# Patient Record
Sex: Male | Born: 1965
Health system: Southern US, Community
[De-identification: ages and names within clinical notes are randomized; demographics above are authoritative.]

## PROBLEM LIST (undated history)

## (undated) DIAGNOSIS — I2699 Other pulmonary embolism without acute cor pulmonale: Secondary | ICD-10-CM

## (undated) DIAGNOSIS — C801 Malignant (primary) neoplasm, unspecified: Secondary | ICD-10-CM

## (undated) DIAGNOSIS — M5430 Sciatica, unspecified side: Secondary | ICD-10-CM

## (undated) HISTORY — PX: ANKLE SURGERY: SHX546

## (undated) HISTORY — PX: OTHER SURGICAL HISTORY: SHX169

---

## 2001-03-01 HISTORY — PX: OTHER SURGICAL HISTORY: SHX169

## 2004-02-06 ENCOUNTER — Emergency Department (HOSPITAL_COMMUNITY): Admission: EM | Admit: 2004-02-06 | Discharge: 2004-02-06 | Payer: Self-pay | Admitting: Emergency Medicine

## 2004-09-01 ENCOUNTER — Emergency Department (HOSPITAL_COMMUNITY): Admission: EM | Admit: 2004-09-01 | Discharge: 2004-09-01 | Payer: Self-pay | Admitting: Emergency Medicine

## 2004-12-30 ENCOUNTER — Emergency Department (HOSPITAL_COMMUNITY): Admission: EM | Admit: 2004-12-30 | Discharge: 2004-12-30 | Payer: Self-pay | Admitting: Emergency Medicine

## 2005-01-04 ENCOUNTER — Inpatient Hospital Stay (HOSPITAL_COMMUNITY): Admission: EM | Admit: 2005-01-04 | Discharge: 2005-01-19 | Payer: Self-pay | Admitting: Emergency Medicine

## 2005-01-05 ENCOUNTER — Encounter (INDEPENDENT_AMBULATORY_CARE_PROVIDER_SITE_OTHER): Payer: Self-pay | Admitting: Cardiology

## 2005-01-09 ENCOUNTER — Ambulatory Visit: Payer: Self-pay | Admitting: Emergency Medicine

## 2005-04-20 ENCOUNTER — Inpatient Hospital Stay (HOSPITAL_COMMUNITY): Admission: EM | Admit: 2005-04-20 | Discharge: 2005-04-28 | Payer: Self-pay | Admitting: Emergency Medicine

## 2005-04-20 ENCOUNTER — Ambulatory Visit: Payer: Self-pay | Admitting: Family Medicine

## 2010-06-06 ENCOUNTER — Emergency Department (HOSPITAL_COMMUNITY)
Admission: EM | Admit: 2010-06-06 | Discharge: 2010-06-06 | Payer: Self-pay | Source: Home / Self Care | Admitting: Emergency Medicine

## 2010-06-22 ENCOUNTER — Encounter: Payer: Self-pay | Admitting: Orthopedic Surgery

## 2010-10-17 NOTE — Discharge Summary (Signed)
NAME:  Omar Cross, VITAL NO.:  [id number]   MEDICAL RECORD NO.:  [id number]          PATIENT TYPE:  INP   LOCATION:  3703                         FACILITY:  [facility]   PHYSICIAN:  Margaretmary Bayley, M.D.    DATE OF BIRTH:  1965/06/29   DATE OF ADMISSION:  01/04/2005  DATE OF DISCHARGE:  01/19/2005                                 DISCHARGE SUMMARY   DISCHARGE DIAGNOSES:  Acute pulmonary embolization with infarction and  pruritus status post and recent history of a fracture of the lateral  malleolus and status post operative reduction and internal fixation of the  tibia and fibula, chronic cigarette abuse, multiple drug abuse including  cocaine and alcohol.   REASON FOR ADMISSION:  Omar Cross is a 45 year old African American  gentleman and father of five who is seen in the emergency room on the day of  admission with a chief complaint of significant lateral and anterior chest  pain and shortness of breath.  The patient states that he had had his first  episode of chest pain about two days prior to admission and about two to  three days subsequent to sustaining a fracture of his right ankle.  The  patient states that an initial episode of chest pain was fairly significant  and predominantly involved the right greater than left chest area.  He  stated that he had no significant shortness of breath, no diaphoresis,  nausea and vomiting and no syncope or presyncope.  He had a second bout of  chest pain on the day of admission which was much more intense to the point  where he found it very difficult to breathe and each time he tried to take  an inhalation, he developed significant pain in the right greater than left  lateral chest area.  He stated that he felt like he was going to faint and  he felt quite drained and was subsequently brought into the emergency room  for evaluation.  On his evaluation in the emergency room, it was noted that  the patient did have some  tachypnea and was noted to be very uncomfortable.  A subsequent work up was remarkable for an elevation of his D-dimer levels  as well as a spiral CT scan of the chest remarkable for changes consistent  with an acute pulmonary embolization and the patient is admitted.   PERTINENT PHYSICAL FINDINGS:  GENERAL:  He is a well-developed, well-  nourished gentleman appearing quite anxious.  VITAL SIGNS:  His blood pressure is 110/78 with a pulse rate of 84.  Temperature is 98.2 and respiratory rate is 24-28 per minute.  SKIN:  His skin is cool and moist.  NECK:  His neck is without any jugulovenous distention.  CHEST:  There was a decrease in inspiration with some splinting of the right  lateral chest area.  He had diminished breath sounds at the bases and  particularly on the right in the posterolateral region.  No wheezes or  consolidation was noted.  CARDIAC:  His precordium was hyperdynamic at 3+/4.  He had a regular rhythm  and no gallops or rubs were  noted.  ABDOMEN:  Soft, nontender.  No distention.  There was no organomegaly.  No  masses.  EXTREMITIES:  He had no cyanosis, clubbing or edema on the left.  His right  distal extremity was remarkable for a soft cast which had been evaluated by  orthopedics several days prior to admission.  He had no evidence of any  perfusion deficits on the toes.   PERTINENT LABORATORY DATA:  A spiral CT scan did reveal changes consistent  with bilateral pulmonary emboli with effusions, right greater than left.  There was a fairly large embolus seen within the posterior basilar right  lower lobe pulmonary artery branch.  There was air space disease distal to  this area and felt to be consistent with a pulmonary infarction.  There was  no mediastinal lymphadenopathy and his central airways were all patent.  These changes were felt to be consistent with an acute subsegmental  pulmonary embolus involving the posterior basilar segment of the right lower   lobe with infarction.  A subsequent venous Doppler revealed on evidence of  deep venous thrombosis, no supraventricular tachycardia or Baker's cyst.   HOSPITAL COURSE:  Omar Cross was admitted and initially was started on a  continuous infusion of heparin before having this regimen changed to Lovenox  given by injections b.i.d.  He continued to have fairly significant  pleuritic chest pain for the initial two to three days of his  hospitalization and initially did have some hypoxemia which was treated with  nasal tube.  A 2D echocardiogram revealed some mild mitral regurgitation but  there was no evidence of significant pulmonary artery hypertension and there  was no evidence of source of emboli found in the heart.  As noted above,  there was likewise no definitive deep venous thrombosis in the lower  extremities however, it was felt that his pulmonary embolus was probably  related to the fracture in his leg sustained a little less than one week  prior to this episode.  The patient was seen in consultation by orthopedic  surgery who felt that the patient would have to have open reduction and  internal fixation of the fracture site.  Because of  his impending surgery,  the patient's coumadinization was delayed until his postoperative period  occurred.  The patient was a fairly challenging individual as far as pain  management.  This was felt to be related to the fact that he was a cocaine  abuser as well as alcohol and other drugs, making the dose of his analgesia  much more significant than the average patient.  He was subsequently taken  to the OR after having his Lovenox discontinued and normalization of his  PTT.  The patient underwent the operative procedure without any  difficulties.  There was no significant intra or postoperative bleeding.  Postoperatively, the patient was again heparinized and started on Lovenox and after a three day period of therapeutic Lovenox,  coumadinization was  begun.  He had no significant episodes of rectal or other bleeding during  his period of anticoagulation.  Healing was fairly prompt and the patient  was mobilized fairly promptly by orthopedic surgery.  His condition at the  time of discharge was significantly improved.  He continued to require  fairly significant oral analgesics in the form of Dilaudid as well as the  addition of Lexapro 10 mg per day for some depression which was or has been  a problem in the past.  His discharge Coumadin dose  is 10 mg every other day  alternating with 12.5.  His Coumadin dosing will be managed through my  office as an outpatient and the patient is instructed on the interactions of  his medications as well as diet regarding the effects on his Coumadin  requirement.  He is scheduled to be seen back in the office in two weeks.  He will be seen by Dr. Ranell Patrick, the orthopedic surgeon in one week to  evaluate his postoperative care and the incision site.           ______________________________  Margaretmary Bayley, M.D.     PC/MEDQ  D:  04/08/2005  T:  04/09/2005  Job:  [id number]

## 2010-10-17 NOTE — H&P (Signed)
NAME:  Omar Cross, Omar Cross          ACCOUNT NO.:  [id number]   MEDICAL RECORD NO.:  [id number]          PATIENT TYPE:  INP   LOCATION:  1825                         FACILITY:  [facility]   PHYSICIAN:  Wayne A. Sheffield Slider, M.D.    DATE OF BIRTH:  Jul 29, 1965   DATE OF ADMISSION:  04/20/2005  DATE OF DISCHARGE:                                HISTORY & PHYSICAL   PRIMARY CARE PHYSICIAN:  Unassigned.   REFERRING PHYSICIAN:  None.   CHIEF COMPLAINT:  Shortness of breath and chest pain.   HISTORY OF PRESENT ILLNESS:  Add is a 45 year old African-American male  with history of ORIF right ankle in August as well as PE in August that  occurred after the ankle fracture, UTI with shortness of breath and chest  pain x1 week which began shortly after stopped taking Coumadin as  prescribed.  He began having intense lateral chest pain one week ago with  dyspnea on exertion.  Due to his prior experience in August with a pulmonary  embolus with similar symptoms, he began taking 5 mg Coumadin daily instead  of his normal dose of 2.5 mg on odd days and 2 mg on even days.  This seemed  to cause the pain to lessen.  However, the pain never completely  disappeared, so Omar Cross came to the ED today.  For the past two weeks, he  has been taking Coumadin off and on, says he has been running out of pills  and his child support has been sent to him from the mother who has 5  children who stay with him.  Today he rates the pain as 9/10.  There is no  pain when he is not breathing.  If he takes a breath, he has a sharp pain in  his right side.  Deep breathing causes coughing.  He denies fever, lower  extremity swelling, or erythema.  The patient was supposed to see his  orthopedist two months after his surgery for the ankle fracture; however, he  did not return, and thus his cast was still there today.  Cast was removed  in the ED.  The patient has not seen any physician since his hospitalization  in August.   In the  emergency department, he was given 2 Percocet.  Also, a CAM boot was  placed on his right foot, and the cast was removed.   PAST MEDICAL HISTORY:  1.  ORIF of right ankle by Dr. Ranell Patrick on January 12, 2005.  2.  January 06, 2005, PE in the right lower lobe which occurred right after he      had fractured his foot but prior to the surgery.  He was placed on      Coumadin and continued on this until recently.  3.  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  He follows up at [facility] every year for this      and has had no recurrence.  4.  Hernia repair at age 56 or 30.  5.  His left index finger  was crushed a long time ago, and he has had      surgery for this.  6.  History of broken left leg.   ALLERGIES:  No known drug allergies.   MEDICATIONS:  The patient is only taking Coumadin 2.5 mg p.o. on odd days  and 2 mg on even days.   FAMILY HISTORY:  Significant for uncle and aunt both on Coumadin.  Aunt had  DVT, uncle had PE.  Also mother has thyroid problems as well as sickle cell  trait.  Father is alive and well.  Sister is on dialysis for lupus.  Two  grandmothers, one on mom's side, one on dad's side, CVAs, one in her 51s,  the other in 77s.   SOCIAL HISTORY:  [place] with his five children ages 74, 69, 76, 61, and 2,  here in [place].  Works as a Surveyor, minerals on homes.  He has a history of  substance abuse.  He drinks 12 ounces of alcohol 3 to 4 times per weeks.  He  smokes 2 packs of cigarettes per week.  He used significantly more alcohol  and smoked more prior to surgery in August.  He also has a history of using  multiple illegal drugs; however, he denies having used these for many years.  He uses a cane and wheelchair to get around.  He has difficulty affording  medication.  His mother and children pay him child support.   REVIEW OF SYSTEMS:  The patient has experienced no weight changes or fevers,  no chills.   HEENT:  No rhinorrhea, no sore throat.  Coughing with deep  breathing.  Per HPI.  Chest pain is mainly on his right side, although on  his left that is sharp and occurs mainly when he breathes in deeply.  No  nausea, vomiting, diarrhea.  No constipation.  No hematemesis.  No  hematuria.  The patient denies weakness.  He has had some lightheadedness  when breathing in deeply; however, no syncope, no numbness or tingling.  Also denies any problems urinating.  No weakness in one side versus the  other.   ADVANCED DIRECTIVES:  The patient is a full code.   PHYSICAL EXAMINATION:  GENERAL:  The patient is a pleasant African-American  male in no acute distress.  VITAL SIGNS:  Temperature 96.7, heart rate 64, blood pressure 172/82,  respiratory rate 20, O2 saturation 99% on room air.  HEENT:  Extraocular muscles intact.  Pupils equal, round, and reactive to  light and accommodation.  Sclerae and conjunctivae normal.  Oropharynx  without exudate or erythema.  Mucous membranes moist.  NECK:  Shotty lymphadenopathy.  Normal thyroid.  CARDIOVASCULAR:  Regular rate and rhythm without murmurs, rubs, or gallops.  LUNGS:  Clear to auscultation bilaterally but decreased breath sounds in the  right lower lobe.  ABDOMEN:  Normoactive bowel sounds.  Soft, nondistended, nontender.  No  hepatosplenomegaly.  EXTREMITIES:  Right leg is significantly smaller than left.  Cast on right  foot with considerable drawing and thick peeling of skin.  No erythema or  edema in either extremity.  MUSCULOSKELETAL/NEUROLOGIC: The patient has normal muscle strength except  for right ankle.  Range of motion in his right ankle is constricted due to  pain.  Cranial nerves II-XII grossly intact.  The patient was alert and  oriented x3 with 2+ reflexes on the right, no reflex able to be elicited on  the left.   LABORATORY DATA:  Test results on labs:  PT 12.5, PTT 29, INR 0.9. Hematocrit 47, hemoglobin 16.  Sodium 138,  potassium 3.8, chloride 106, BUN  15, creatinine 1.1, glucose 8.9.  on latest blood gas, pH 7.37, PCO2 47,  bicarb 27.4, excess 1.   Chest x-ray showed air space opacities posteriorly in the right lower lobe.   Chest CT, PE protocol showed possible PE in right lower lobe subsegmental  right lower lobe PA branch, same artery as in August.   Plain films of the ankle showed osteolysis of the right ankle due to disuse.   ASSESSMENT AND PLAN:  The patient is a 45 year old African-American male  with history of open reduction and internal fixation of his right ankle,  pulmonary embolus, and substance abuse, now here with shortness of breath  and pulmonary embolus likely secondary to pulmonary embolus seen on spiral  CT.  1.  Pulmonary embolus.  The patient is subtherapeutic on Coumadin.  Will      give 1 dose Coumadin 5 mg and start IV heparin per pharmacy and      transition to Coumadin. Will get hemoccult prior to this to assure no      occult bleeding that would be a contraindication to heparin.  EKG as      this has not been done yet.  Will also get analyzed PT, INR, and PTT      ever day.  2.  Open reduction and internal fixation right ankle.  The patient just      recently had cast removed.  Ankle films show osteolysis most likely due      to disuse.  Will place in CAM boot.  PT will be consulted.  We will      ensure patient follows up with orthopedics.  3.  History of alcohol and substance abuse.  We will continue to monitor for      signs of withdrawal while in house.  Urine drug screen was ordered.      Will give multivitamins,  thiamine, folate, normal saline 125 mL per      hour.  4.  Pain control.  Will given ibuprofen 63 mg p.o. q.6 h. p.r.n. as well as      Percocet 5/325 q.4 h. p.r.n.  Has history of possibility of abusing pain      medicines, will try to avoid Dilaudid.  5.  Fluid, electrolytes, and nutrition and gastrointestinal.  Electrolytes      were normal.  Will  start D5 half normal saline KVO since need IV for      heparin.  Regular diet.  Mild of magnesia p.r.n. constipation since on      Percocet.  6.  Disposition.  Will consider social work consult since unable to afford      medications.  Will discharge pending patient has therapeutic INR and is      on Coumadin.      Dwana Curd Para March, M.D.    ______________________________  Arnette Norris. Sheffield Slider, M.D.    GSD/MEDQ  D:  04/20/2005  T:  04/20/2005  Job:  [id number]

## 2010-10-17 NOTE — Op Note (Signed)
NAME:  Omar Cross, Omar Cross NO.:  [id number]   MEDICAL RECORD NO.:  [id number]          PATIENT TYPE:  INP   LOCATION:  3703                         FACILITY:  [facility]   PHYSICIAN:  Almedia Balls. Ranell Patrick, M.D. DATE OF BIRTH:  02-Aug-1965   DATE OF PROCEDURE:  01/12/2005  DATE OF DISCHARGE:                                 OPERATIVE REPORT   PREOPERATIVE DIAGNOSIS:  Right ankle fracture.   POSTOPERATIVE DIAGNOSIS:  Right ankle fracture.   PROCEDURE PERFORMED:  Open reduction and internal fixation of right  pronation and external rotation for ankle fracture-dislocation with  placement of syndesmosis screw.   ATTENDING SURGEON:  Almedia Balls. Ranell Patrick, MD.   ASSISTANT:  ___________ PA-C.   ANESTHESIA:  General anesthesia.   ESTIMATED BLOOD LOSS:  None.   TOURNIQUET TIME:  One hour.   INSTRUMENT COUNT:  Correct.   COMPLICATIONS:  None.   MEDICATIONS GIVEN:  Preoperative antibiotics were given.   INDICATIONS:  The patient is a 45 year old male, who suffered a fracture-  dislocation of the right ankle.  The patient is currently admitted for a  pulmonary embolism and had to delay the surgery of about 2 weeks.  The  patient presents now for operative reduction and stabilization of his  unstable ankle fracture.  Informed consent was obtained.   DESCRIPTION OF PROCEDURE:  After an adequate level of anesthesia was  achieved, the patient was positioned on the radiolucent operating room  table.  The C-arm was utilized throughout the case in multiple planes.  A  lateral approach to the distal tibia was performed using a 10-blade scalpel  dissection and was carried sharply down through the subcutaneous tissues.  The periosteum was identified and incised sharply, large full thickness  flaps were raised, the fracture site was identified, it was curetted free of  soft tissue and early fracture callus.  We immobilized the fracture,  irrigated and debrided the joint, which we could see  through the lateral  fracture line, and then we anatomically reduced the fracture using a crab-  claw clamp, axial traction, and internal rotation.  We then placed a single  anterior to posterior lag screw, which gained fairly good fixation and  anatomic reduction.  We then next placed an anti-glide plate to prevent  proximal migration given that this was 2 weeks out and fairly shortened, and  this was a way to close the door on any proximal migration of the fracture.  We then placed a neutralization plate on laterally.  This was an 8-hole one-  third tubular plate with 3 bicortical screws proximally and 2 fully threaded  cancellous screws distally, and then placed a single 3.5 mm syndesmosis  screw through the neutralization plate laterally to medially into the tibia.  We had a placed a king-kong clamp to apply a nice medial and lateral  compression, made sure the joint mortise was anatomic, and then fixed the  syndesmosis with a single 3.5 4-cortex screw.  We fully irrigated the wound  and closed it in a layered closure using Vicryl and staples.  A sterile  dressing was applied followed by a short-leg  splint.  The patient tolerated  the surgery well.  Again, a multiplanar C-arm was utilized for fracture  reduction, for hardware placement, and for final pictures.  The patient was  taken to the recovery room in stable condition.           ______________________________  Almedia Balls Ranell Patrick, M.D.     SRN/MEDQ  D:  01/12/2005  T:  01/12/2005  Job:  [id number]

## 2010-10-17 NOTE — Discharge Summary (Signed)
NAME:  RANIER, COACH          ACCOUNT NO.:  [id number]   MEDICAL RECORD NO.:  [id number]          PATIENT TYPE:  INP   LOCATION:  5511                         FACILITY:  [facility]   PHYSICIAN:  Santiago Bumpers. Hensel, M.D.DATE OF BIRTH:  1966-05-20   DATE OF ADMISSION:  04/20/2005  DATE OF DISCHARGE:  04/28/2005                                 DISCHARGE SUMMARY   PRIMARY CARE PHYSICIAN:  Margaretmary Bayley, M.D.   FINAL DIAGNOSES:  1.  Pulmonary embolism.  2.  Mood disorder.  3.  Substance abuse.  4.  Right foot fracture.   PRINCIPAL PROCEDURES:  1.  CT angiogram of the chest on April 20, 2005, showed a thrombus in the      right lower lobe pulmonary artery branch.  The same artery was involved      on the prior study in August 2006, but there is now more involvement of      this vessel, likely due to a new embolus as opposed to a residual      thrombus.  Also some right lower lobe air space disease distal to this      filling defect but difficult to say if this is new disease or residual      infarct from the prior insult in August 2006.  2.  Post-ORIF ankle views for fibular fracture after cast removal on      April 20, 2005, showed good position and alignment following ORIF for      distal fibular fracture but significant osteolysis had developed, which      was presumed to be due to osteoporosis.   LABORATORY DATA:  On discharge on April 28, 2005, CBC:  White blood cells  8.8, hemoglobin 14.7, hematocrit 42.6, platelets 293.  PT was 24.5, INR 2.2.  The patient had a urine drug screen close to beginning of hospitalization on  April 21, 2005, which was positive for cocaine.  Cardiolipin antibody  April 22, 2005, was within normal limits.  Fecal occult blood on April 21, 2005, was negative.  On admission, April 20, 2005, the patient's PT  was 12.5 and INR was 0.9.  On April 20, 2005, venous blood gas showed pH  of 7.374, PCO2 of 47, bicarb of 27.4, total CO2 of  29, acid-base excess of  1.0.  Sodium 138, potassium 3.8, chloride 106, glucose 89, BUN 15.   HISTORY OF PRESENT ILLNESS:  Lajuane is a 45 year old African-American male  with history of open reduction and internal fixation of right fibular  fracture in August as well as a PE in August that occurred after the ankle  fracture.  He presented to [place] with shortness of breath and chest pain x1  week that began shortly after he stopped taking his Coumadin as prescribed  due to being unable to afford it.  The patient also came in with his cast  still on his right foot, which should have been removed earlier by the  orthopedist; however, the patient did not go to his appointment.  He also  has a history of substance abuse and depression.   HOSPITAL COURSE:  The patient is a 45 year old African-American male with a  right ankle ORIF, resolving PE, urine drug screen positive for cocaine, and  mood disorder.   Problem 1.  PULMONARY EMBOLISM:  The patient was admitted and started on  heparin and Coumadin per pharmacy protocol with goal INR of 2-3.  INR  initially was 0.9 on admission and subsequently rose to 2.2 on discharge.  The patient's symptoms resolved during hospitalization.  At discharge he was  free of chest pain, shortness of breath and coughing.  The patient never had  any symptoms of DVT.  We spoke with the patient about the importance of  follow-up appointments to check his INR level as well as the importance of  taking his daily Coumadin.  Due to his aunt having elevated antithrombin-3,  may consider doing a coagulation workup at a later date as an outpatient  once he is off heparin.  His cardiolipin antibody was negative here in the  hospital.  We do feel that we have a reason for the PE due to patient's  immobilization from ORIF in August.  This is probably more likely than a  hereditary coagulation problem.   Problem 2.  MOOD DISORDER:  The patient was previously on Lexapro after  last  hospitalization in August.  Due to his extremely happy mood and history of  difficulty sleeping and often spending large amounts of money, we consulted  psychiatry to work up possible bipolar disorder.  Psychiatry placed the  patient on Wellbutrin, Lexapro and Seroquel.  We will send the patient home  on these as well and have him follow up with psychiatry as an outpatient.   Problem 3.  RIGHT ANKLE FRACTURE AND OPEN REDUCTION AND INTERNAL FIXATION:  The patient was followed by PT in the hospital.  A CAM boot was placed in  the emergency department.  He has been able to walk with crutches since put  on weightbearing status.  He will follow up with orthopedics as an  outpatient.  We will emphasize the importance of this.   Problem 4.  SUBSTANCE ABUSE:  The patient denied any substance; however, his  UDS was positive for cocaine.  There was some worry regarding him taking  care of his five children at home and using drugs.  Social worker saw the  patient and could find no evidence the patient was using cocaine around the  children and no evidence of neglect, so did not call DSS.  The patient was  initially placed on Percocet and Tylenol for pain management; however, due  to seeking pain medications, Percocet was discontinued as soon as possible.   CONDITION ON DISCHARGE:  Much improved.   INSTRUCTIONS TO PATIENT AND FAMILY:  The patient is to ambulate with  crutches.  He is to follow up with PT, orthopedics, psychiatry and his  primary care physician, Dr. Chestine Spore.  The patient will arrange appointment  with Dr. Chestine Spore himself and Dr. Chestine Spore is to arrange referrals and follow-ups  for orthopedics, PT and psychiatry.   DIET:  Regular.   DISCHARGE MEDICATIONS:  1.  Wellbutrin 150 mg XL p.o. daily.  2.  Lexapro 10 mg p.o. daily.  3.  Seroquel 50 mg p.o. q.h.s.  4.  Coumadin 10 mg p.o. every Saturday, Sunday, Tuesday and Thursday,     alternating with Coumadin 12.5 mg p.o. every  Monday, Wednesday and      Friday.  5.  Ambien 10 mg p.o. p.r.n. sleep.  Hansel Feinstein, M.D.    ______________________________  Santiago Bumpers. Leveda Anna, M.D.    PM/MEDQ  D:  04/28/2005  T:  04/29/2005  Job:  [id number]

## 2012-01-18 ENCOUNTER — Encounter (HOSPITAL_COMMUNITY): Payer: Self-pay | Admitting: *Deleted

## 2012-01-18 ENCOUNTER — Emergency Department (HOSPITAL_COMMUNITY)
Admission: EM | Admit: 2012-01-18 | Discharge: 2012-01-18 | Disposition: A | Discharge status: Home / Self Care | Payer: Self-pay | Attending: Emergency Medicine | Admitting: Emergency Medicine

## 2012-01-18 DIAGNOSIS — M5430 Sciatica, unspecified side: Secondary | ICD-10-CM | POA: Insufficient documentation

## 2012-01-18 DIAGNOSIS — Z8589 Personal history of malignant neoplasm of other organs and systems: Secondary | ICD-10-CM | POA: Insufficient documentation

## 2012-01-18 DIAGNOSIS — M549 Dorsalgia, unspecified: Secondary | ICD-10-CM

## 2012-01-18 DIAGNOSIS — F17200 Nicotine dependence, unspecified, uncomplicated: Secondary | ICD-10-CM | POA: Insufficient documentation

## 2012-01-18 HISTORY — DX: Sciatica, unspecified side: M54.30

## 2012-01-18 HISTORY — DX: Malignant (primary) neoplasm, unspecified: C80.1

## 2012-01-18 MED ORDER — KETOROLAC TROMETHAMINE 60 MG/2ML IM SOLN
60.0000 mg | Freq: Once | INTRAMUSCULAR | Status: AC
  Administered 2012-01-18: 60 mg via INTRAMUSCULAR
  Filled 2012-01-18: qty 2

## 2012-01-18 MED ORDER — OXYCODONE-ACETAMINOPHEN 5-325 MG PO TABS
1.0000 | ORAL_TABLET | Freq: Once | ORAL | Status: AC
  Administered 2012-01-18: 1 via ORAL
  Filled 2012-01-18: qty 1

## 2012-01-18 MED ORDER — IBUPROFEN 800 MG PO TABS: 800.0000 mg | ORAL_TABLET | Freq: Three times a day (TID) | ORAL | Status: AC | PRN

## 2012-01-18 MED ORDER — PERCOCET 5-325 MG PO TABS: 1.0000 | ORAL_TABLET | Freq: Four times a day (QID) | ORAL | Status: AC | PRN

## 2012-01-18 MED ORDER — CYCLOBENZAPRINE HCL 10 MG PO TABS: 10.0000 mg | ORAL_TABLET | Freq: Three times a day (TID) | ORAL | Status: AC | PRN

## 2012-01-18 MED ORDER — HYDROMORPHONE HCL PF 1 MG/ML IJ SOLN
1.0000 mg | Freq: Once | INTRAMUSCULAR | Status: AC
  Administered 2012-01-18: 1 mg via INTRAMUSCULAR
  Filled 2012-01-18: qty 1

## 2012-01-18 NOTE — ED Provider Notes (Signed)
History     CSN: [id number]  Arrival date & time 01/18/12  4098   First MD Initiated Contact with Patient 01/18/12 [phone]      Chief Complaint  Patient presents with  . Hip Pain    right    (Consider location/radiation/quality/duration/timing/severity/associated sxs/prior treatment) HPI Comments: Pt is a current every day smoker with a hx of fibrosarcoma and chronic back pain that presents to the ER for his sciatica. Pt states that he has been followed by his PCP over the last 2 years being diagnosed w sciatica. He was treated previously with muscle relaxers, gabapentin and percocet, but hasn't needed medication for months. The recent exacerbation started about 3 days ago and has been gradually worsening until this morning it became unbearable. Pain is 10/10 in severity with radiation down right leg to about the level of the knee. Pain described as a shooting/stabbing sensation that is worsened by any movement especially ambulation. Pt denies any numbness, tingling or weakness of extremity, bowel or bladder dysfunction, saddle paresthesias, fever, night sweats or chills.   Patient is a 46 y.o. male presenting with hip pain. The history is provided by the patient and the spouse.  Hip Pain This is a chronic problem. The current episode started yesterday. The problem occurs constantly. The problem has been gradually worsening. Pertinent negatives include no abdominal pain, anorexia, arthralgias, change in bowel habit, chest pain, chills, congestion, coughing, diaphoresis, fatigue, fever, headaches, joint swelling, myalgias, nausea, neck pain, numbness, rash, sore throat, swollen glands, urinary symptoms, vertigo, visual change, vomiting or weakness. The symptoms are aggravated by twisting, walking, standing, bending and intercourse. Treatments tried: tramadol. The treatment provided no relief.    Past Medical History  Diagnosis Date  . Sciatic pain   . Cancer     Fibrosarcoma    History  reviewed. No pertinent past surgical history.  No family history on file.  History  Substance Use Topics  . Smoking status: Current Everyday Smoker  . Smokeless tobacco: Not on file  . Alcohol Use: Yes     daily      Review of Systems  Constitutional: Negative for fever, chills, diaphoresis and fatigue.  HENT: Negative for congestion, sore throat and neck pain.   Respiratory: Negative for cough.   Cardiovascular: Negative for chest pain.  Gastrointestinal: Negative for nausea, vomiting, abdominal pain, anorexia and change in bowel habit.  Musculoskeletal: Positive for back pain and gait problem. Negative for myalgias, joint swelling and arthralgias.  Skin: Negative for rash.  Neurological: Negative for vertigo, weakness, numbness and headaches.    Allergies  Review of patient's allergies indicates no known allergies.  Home Medications   Current Outpatient Rx  Name Route Sig Dispense Refill  . ADULT MULTIVITAMIN W/MINERALS CH Oral Take 1 tablet by mouth daily.      BP 126/85  Pulse 106  Temp 98 F (36.7 C) (Oral)  Resp 16  SpO2 98%  Physical Exam  Nursing note and vitals reviewed. Constitutional: He is oriented to person, place, and time. He appears well-developed and well-nourished. No distress.  HENT:  Head: Normocephalic and atraumatic.  Eyes: Conjunctivae and EOM are normal. Pupils are equal, round, and reactive to light. No scleral icterus.  Neck: Normal range of motion and full passive range of motion without pain. Neck supple. No spinous process tenderness and no muscular tenderness present. No rigidity. Normal range of motion present. No Brudzinski's sign noted.  Cardiovascular: Normal rate, regular rhythm and intact distal pulses.  Exam reveals no gallop and no friction rub.   No murmur heard. Pulmonary/Chest: Effort normal and breath sounds normal. No respiratory distress. He has no wheezes. He has no rales. He exhibits no tenderness.  Musculoskeletal:        Cervical back: He exhibits normal range of motion, no tenderness, no bony tenderness and no pain.       Thoracic back: He exhibits no tenderness, no bony tenderness and no pain.       Lumbar back: He exhibits no spasm and normal pulse.       Back:       Right foot: He exhibits no swelling.       Left foot: He exhibits no swelling.       Bilateral lower extremities nontender without color change, baseline range of motion of extremities with intact distal pulses, capillary refill less than 2 seconds bilaterally.  Pt has increased pain w ROM of lumbar spine. Pain w ambulation, no sign of ataxia. Right straight leg test positive. TTP at right SI area, lumbar spine and trochanteric area  Neurological: He is alert and oriented to person, place, and time. He has normal strength and normal reflexes. No sensory deficit. Gait (no ataxia, slowed and hunched d/t pain ) abnormal.       Sensation at baseline for light touch in all 4 distal extremities, motor symmetric & bilateral 5/5 (hips: abduction, adduction, flexion; knee: flexion & extension; foot: dorsiflexion, plantar flexion, toes: dorsi flexion) Patellar & ankle reflexes intact.   Skin: Skin is warm and dry. No rash noted. He is not diaphoretic. No erythema. No pallor.  Psychiatric: He has a normal mood and affect.    ED Course  Procedures (including critical care time)  Labs Reviewed - No data to display No results found.   No diagnosis found.    MDM  Back pain, sciatica  Patient with back pain.  No neurological deficits and normal neuro exam.  Patient can walk but states is painful.  No loss of bowel or bladder control.  No concern for cauda equina.  No fever, night sweats, weight loss, IVDU.  RICE protocol and pain medicine indicated and discussed with patient. Pt dc with Vicodin, motrin & flexaril. Note that pt has a hx of fibrosarcoma and that strict return precautions were discussed.         Jaci Carrel, [place] 01/18/12 1009

## 2012-01-18 NOTE — ED Notes (Signed)
Pt has been told he has sciatica pain and now it is worse than ever,  Pt reports pain to right hip and down right leg and lower back and right hip area

## 2012-01-18 NOTE — ED Notes (Signed)
Lisette, PA at the bedside.  

## 2012-01-20 NOTE — ED Provider Notes (Signed)
Medical screening examination/treatment/procedure(s) were performed by non-physician practitioner and as supervising physician I was immediately available for consultation/collaboration.  Shelda Jakes, MD 01/20/12 2049

## 2012-04-14 ENCOUNTER — Encounter (HOSPITAL_COMMUNITY): Payer: Self-pay | Admitting: *Deleted

## 2012-04-14 ENCOUNTER — Emergency Department (HOSPITAL_COMMUNITY)
Admission: EM | Admit: 2012-04-14 | Discharge: 2012-04-14 | Disposition: A | Discharge status: Home / Self Care | Payer: Medicaid Other | Attending: Emergency Medicine | Admitting: Emergency Medicine

## 2012-04-14 DIAGNOSIS — M549 Dorsalgia, unspecified: Secondary | ICD-10-CM

## 2012-04-14 DIAGNOSIS — F17200 Nicotine dependence, unspecified, uncomplicated: Secondary | ICD-10-CM | POA: Insufficient documentation

## 2012-04-14 DIAGNOSIS — C261 Malignant neoplasm of spleen: Secondary | ICD-10-CM | POA: Insufficient documentation

## 2012-04-14 MED ORDER — DIAZEPAM 5 MG PO TABS
5.0000 mg | ORAL_TABLET | Freq: Once | ORAL | Status: AC
  Administered 2012-04-14: 5 mg via ORAL
  Filled 2012-04-14: qty 1

## 2012-04-14 MED ORDER — KETOROLAC TROMETHAMINE 30 MG/ML IJ SOLN
30.0000 mg | Freq: Once | INTRAMUSCULAR | Status: AC
  Administered 2012-04-14: 30 mg via INTRAVENOUS
  Filled 2012-04-14: qty 1

## 2012-04-14 MED ORDER — DIAZEPAM 5 MG PO TABS: 5.0000 mg | ORAL_TABLET | Freq: Two times a day (BID) | ORAL | Status: DC

## 2012-04-14 MED ORDER — HYDROCODONE-ACETAMINOPHEN 10-325 MG PO TABS: 1.0000 | ORAL_TABLET | Freq: Four times a day (QID) | ORAL | Status: DC | PRN

## 2012-04-14 MED ORDER — OXYCODONE-ACETAMINOPHEN 5-325 MG PO TABS
1.0000 | ORAL_TABLET | Freq: Once | ORAL | Status: AC
  Administered 2012-04-14: 1 via ORAL
  Filled 2012-04-14: qty 1

## 2012-04-14 MED ORDER — IBUPROFEN 600 MG PO TABS: 600.0000 mg | ORAL_TABLET | Freq: Four times a day (QID) | ORAL | Status: DC | PRN

## 2012-04-14 NOTE — ED Provider Notes (Signed)
History  Scribed for Gerhard Munch, MD, the patient was seen in room TR10C/TR10C. This chart was scribed by Candelaria Stagers. The patient's care started at 4:48 PM   CSN: [id number]  Arrival date & time 04/14/12  1531   First MD Initiated Contact with Patient 04/14/12 1646      Chief Complaint  Patient presents with  . Sciatica     The history is provided by the patient. No language interpreter was used.   Omar Cross is a 46 y.o. male who presents to the Emergency Department complaining of back pain associated with chronic sciatica that became worse four days ago.  The pain radiates down the back of his right leg.  He denies fevers, loss of sensation, or incontinence.  Pt reports he is out of pain medication and does not have an appointment until next month.  Pt has h/o fibrosarcoma.    Past Medical History  Diagnosis Date  . Sciatic pain   . Cancer     Fibrosarcoma    History reviewed. No pertinent past surgical history.  History reviewed. No pertinent family history.  History  Substance Use Topics  . Smoking status: Current Every Day Smoker  . Smokeless tobacco: Not on file  . Alcohol Use: Yes     Comment: daily      Review of Systems  Constitutional:       Per HPI, otherwise negative  HENT:       Per HPI, otherwise negative  Eyes: Negative.   Respiratory:       Per HPI, otherwise negative  Cardiovascular:       Per HPI, otherwise negative  Gastrointestinal: Negative for vomiting.  Genitourinary: Negative.   Musculoskeletal: Positive for back pain (associated with sciatica).       Per HPI, otherwise negative  Skin: Negative.   Neurological: Negative for syncope.  All other systems reviewed and are negative.    Allergies  Review of patient's allergies indicates no known allergies.  Home Medications   Current Outpatient Rx  Name  Route  Sig  Dispense  Refill  . ADULT MULTIVITAMIN W/MINERALS CH   Oral   Take 1 tablet by mouth daily.             BP 145/93  Pulse 104  Temp 98.5 F (36.9 C) (Oral)  Resp 16  SpO2 96%  Physical Exam  Nursing note and vitals reviewed. Constitutional: He is oriented to person, place, and time. He appears well-developed and well-nourished. No distress.  HENT:  Head: Normocephalic and atraumatic.  Eyes: EOM are normal.  Neck: Neck supple. No tracheal deviation present.  Cardiovascular: Normal rate.   Pulmonary/Chest: Effort normal. No respiratory distress.  Musculoskeletal: Normal range of motion.       Tenderness over the SI joint.    Neurological: He is alert and oriented to person, place, and time.  Skin: Skin is warm and dry.  Psychiatric: He has a normal mood and affect. His behavior is normal.    ED Course  Procedures   DIAGNOSTIC STUDIES: Oxygen Saturation is 96% on room air, normal by my interpretation.    COORDINATION OF CARE:     Labs Reviewed - No data to display No results found.   No diagnosis found.  MDM  I personally performed the services described in this documentation, which was scribed in my presence. The recorded information has been reviewed and is accurate.  This patient with a long history of back pain presents  with acute exacerbation.  On exam he is in no distress.  The patient has no neurologic deficiencies, and his vital signs are largely unremarkable.  The patient was provided analgesia, prescriptions for more, and advised to follow up with his primary care physician as soon as possible.  Gerhard Munch, MD 04/14/12 1705

## 2012-04-14 NOTE — ED Notes (Signed)
Patient with history of sciatica is here with c/o pain in his back that radiates to his right leg more than the left.  Patient is able to ambulate.  States that he has MD appt. In December and he is out of his pain medications

## 2012-09-06 ENCOUNTER — Encounter: Payer: Self-pay | Admitting: Internal Medicine

## 2012-09-06 ENCOUNTER — Ambulatory Visit (INDEPENDENT_AMBULATORY_CARE_PROVIDER_SITE_OTHER): Payer: Self-pay | Admitting: Internal Medicine

## 2012-09-06 VITALS — BP 138/92 | HR 95 | Temp 97.6°F | Ht 74.0 in | Wt 257.5 lb

## 2012-09-06 DIAGNOSIS — C499 Malignant neoplasm of connective and soft tissue, unspecified: Secondary | ICD-10-CM | POA: Insufficient documentation

## 2012-09-06 DIAGNOSIS — M549 Dorsalgia, unspecified: Secondary | ICD-10-CM | POA: Insufficient documentation

## 2012-09-06 LAB — COMPLETE METABOLIC PANEL WITH GFR
ALT: 60 U/L — ABNORMAL HIGH (ref 0–53)
AST: 48 U/L — ABNORMAL HIGH (ref 0–37)
Albumin: 4.8 g/dL (ref 3.5–5.2)
Alkaline Phosphatase: 91 U/L (ref 39–117)
BUN: 15 mg/dL (ref 6–23)
CO2: 27 mEq/L (ref 19–32)
Calcium: 9.8 mg/dL (ref 8.4–10.5)
Chloride: 104 mEq/L (ref 96–112)
Creat: 1.18 mg/dL (ref 0.50–1.35)
GFR, Est African American: 85 mL/min
GFR, Est Non African American: 74 mL/min
Glucose, Bld: 100 mg/dL — ABNORMAL HIGH (ref 70–99)
Potassium: 5 mEq/L (ref 3.5–5.3)
Sodium: 138 mEq/L (ref 135–145)
Total Bilirubin: 0.5 mg/dL (ref 0.3–1.2)
Total Protein: 7.7 g/dL (ref 6.0–8.3)

## 2012-09-06 LAB — CBC WITH DIFFERENTIAL/PLATELET
Basophils Absolute: 0 10*3/uL (ref 0.0–0.1)
Basophils Relative: 0 % (ref 0–1)
Eosinophils Absolute: 0.2 10*3/uL (ref 0.0–0.7)
Eosinophils Relative: 2 % (ref 0–5)
HCT: 46.9 % (ref 39.0–52.0)
Hemoglobin: 15.8 g/dL (ref 13.0–17.0)
Lymphocytes Relative: 45 % (ref 12–46)
Lymphs Abs: 3.4 10*3/uL (ref 0.7–4.0)
MCH: 30.6 pg (ref 26.0–34.0)
MCHC: 33.7 g/dL (ref 30.0–36.0)
MCV: 90.9 fL (ref 78.0–100.0)
Monocytes Absolute: 0.6 10*3/uL (ref 0.1–1.0)
Monocytes Relative: 8 % (ref 3–12)
Neutro Abs: 3.3 10*3/uL (ref 1.7–7.7)
Neutrophils Relative %: 45 % (ref 43–77)
Platelets: 298 10*3/uL (ref 150–400)
RBC: 5.16 MIL/uL (ref 4.22–5.81)
RDW: 14.2 % (ref 11.5–15.5)
WBC: 7.5 10*3/uL (ref 4.0–10.5)

## 2012-09-06 LAB — SEDIMENTATION RATE: Sed Rate: 1 mm/hr (ref 0–16)

## 2012-09-06 MED ORDER — MELOXICAM 7.5 MG PO TABS: 7.5000 mg | ORAL_TABLET | Freq: Every day | ORAL | Status: DC

## 2012-09-06 MED ORDER — GABAPENTIN 300 MG PO CAPS
300.0000 mg | ORAL_CAPSULE | Freq: Three times a day (TID) | ORAL | Status: DC
Start: 2012-09-06 — End: 2019-01-09

## 2012-09-06 NOTE — Assessment & Plan Note (Signed)
Patient had a history of fibrosarcoma at left lower back area. Now he is s/p of surgery about 9 years ago. He did not need chemotherapy or radiation therapy after surgery. He stopped f/u with is surgeon, Dr. Byrd Hesselbach at [facility] about years ago.  He noted three hard nodules in the past 2 years, but did not seek for evaluation. Although he does not have alarming symptoms, such as weight loss and has no lymph node enlargement in axillary and groin areas, it is very important to rule out any possibility for recurrent fibrosarcoma. He needs biopsy. I discussed with patient for surgery referral. Currently patient would like to call his surgeon, Dr. Byrd Hesselbach for an appointment. If he cannot get appointment with the Dr. Byrd Hesselbach, we will give patient referral to surgery.

## 2012-09-06 NOTE — Assessment & Plan Note (Signed)
Patient's her back pain is most likely due to sciatica given the typical pattern of back pain and positive leg raise tests. Currently patient does not have alarming symptoms, such as weakness or incontinence. Given his history of fibrosarcoma and significant worsening of his back pain, he will need an MRI image of lumber spin.  - Will treat with gabapentin 300 mg 3 times a day. - Will treat meloxicam 7.5 mg daily. -will get basic lab tests: CMP, CBC with diff, ESR - will let patient see our financial counselor for orange card application.

## 2012-09-06 NOTE — Progress Notes (Addendum)
Patient ID: Omar Cross Cross, male   DOB: Sep 30, 1965, 47 y.o.   MRN: [id number]  Subjective:   Patient ID: Omar Cross Cross male   DOB: [birth year] 47 y.o.   MRN: [id number]  CC:  Followup visit for back pain HPI:  Omar Cross Cross is a 47 y.o. man with past medical history as outlined below, who presents for a followup visit.   1. Fibrosarcoma:  Patient reports that he had a history of fibrosarcoma at left lower back area. He had surgery about 9 years ago. After that surgery, patient did not need chemotherapy or radiation therapy. He had followed up with his surgeon Dr. Byrd Hesselbach at [facility] yearly for about 3 years and then stopped his f/u. He noticed three nodules in his body, one is in left frontal head area, second one under his left breast, and third one is located at left middle quadrant of abdominal wall. Those 3 nodules have been there for more than 2 years. The one under his left breast area seems to be enlarging in size. No tenderness or other symptoms. He did not notice any lymph node enlargement in axillary and groin area. No significant weight loss.   2. Back pain: Patient reports that he has been diagnosed with sciatica nerve pain at 2 years after his surgery. He has been taking gabapentin, ibuprofen, Flexeril, and occasionally takes narcotic medications for his back pain. Recently he feels like his back pain is gradually getting worse. His back pain is very severe, and can reach up to 9/10 in severity. His back pain is located at the lower back bilaterally and radiates down to his knee posteriorly.  He does not have incontinence, weakness or decreased sensations in his legs.  Denies fever, chills, fatigue, headaches, cough, chest pain, SOB,  abdominal pain,diarrhea, constipation, dysuria, urgency, frequency, hematuria.    Past Medical History  Diagnosis Date  . Sciatic pain   . Cancer     Fibrosarcoma   Current Outpatient Prescriptions  Medication Sig Dispense  Refill  . diazepam (VALIUM) 5 MG tablet Take 1 tablet (5 mg total) by mouth 2 (two) times daily.  10 tablet  0  . HYDROcodone-acetaminophen (NORCO) 10-325 MG per tablet Take 1 tablet by mouth every 6 (six) hours as needed for pain.  15 tablet  0  . ibuprofen (ADVIL,MOTRIN) 600 MG tablet Take 1 tablet (600 mg total) by mouth every 6 (six) hours as needed for pain.  12 tablet  0  . Multiple Vitamin (MULTIVITAMIN WITH MINERALS) TABS Take 1 tablet by mouth daily.       No current facility-administered medications for this visit.   No family history on file. History   Social History  . Marital Status: Married    Spouse Name: N/A    Number of Children: N/A  . Years of Education: N/A   Social History Main Topics  . Smoking status: Current Every Day Smoker -- 0.50 packs/day  . Smokeless tobacco: Not on file     Comment: trying   . Alcohol Use: Yes     Comment: daily  . Drug Use: No  . Sexually Active: Not on file   Other Topics Concern  . Not on file   Social History Narrative  . No narrative on file    Review of Systems: as per HPI  Objective:  Physical Exam: Filed Vitals:   09/06/12 1027  BP: 138/92  Pulse: 95  Temp: 97.6 F (36.4 C)  TempSrc: Oral  Height: 6\' 2"  (1.88 m)  Weight: 257 lb 8 oz (116.801 kg)  SpO2: 98%   Constitutional: Vital signs reviewed. in no acute distress and cooperative with exam.   HEENT:  Head: Normocephalic and atraumatic. There is hard nodule over the left frontal head area. Not moveable. No tenderness. Proximately 1.2 cm X 1.2 cm in size. Ear: TM normal bilaterally Mouth: no erythema or exudates, MMM Eyes: PERRL, EOMI, conjunctivae normal, No scleral icterus.  Neck: Supple, Trachea midline normal ROM, No JVD, mass, thyromegaly, or carotid bruit present. No lymph node enlargement. Cardiovascular: RRR, S1 normal, S2 normal, no MRG, pulses symmetric and intact bilaterally Pulmonary/Chest: CTAB, no wheezes, rales, or rhonchi. There is hard  nodule under left breast area, poorly moveable. No tenderness. Proximately 0.6 X 1.0 cm in size. Abdominal: Soft. Non-tender, non-distended, bowel sounds are normal, organomegaly, or guarding present. There is hard nodule at left middle quadrant of abdominal wall. Poorly moveable. No tenderness. Proximately 1.2 cm X 2.0 cm in size. GU: no CVA tenderness Musculoskeletal: there is tenderness over the SI joints bilaterally. Leg raise tests are positive bilaterally. Hematology: no cervical, inginal, or axillary adenopathy. there is no lymph node enlargement in axillary and a groin areas Neurological: A&O x3, Strength is normal and symmetric bilaterally, cranial nerve II-XII are grossly intact, no focal motor deficit, sensory intact to light touch bilaterally. Brachial reflex 2+ bilaterally. Knee reflex 2+ bilaterally.  Skin: Warm, dry and intact. No rash, cyanosis, or clubbing.  Psychiatric: Normal mood and affect. speech and behavior is normal. Judgment and thought content normal. Cognition and memory are normal.   Assessment & Plan:   Addendum 09/20/12:   I called patient and explained his MRI-lumbar results. I suggested him to start working on loosing weight. He told me that he was already seen by his surgeon. He is going to do biopsy of the nodules on 09/23/12 by his surgeon. I told him that I will send him the MRI-result by letter and asked him to bring MRI result to his surgeon. I discussed his results with Dr. Meredith Pel who agreed to give patient a referral to neurosurgeon.  Lorretta Harp, MD  PGY2, Internal Medicine Teaching Service  Pager: [phone]     Addendum:  Patient's MRI-lumbar spine showed Negative for disc bulge or protrusion. There is diffuse epidural lipomatosis. Epidural fat at L4-5 causes crowding of descending nerve roots. Treatment depends upon severity of neurological deficits. Conservative treatment includes weaning of exogenous steroids or treatment of Cushing disease and weight  loss.  But our patient does not use steroid. He is also unlikely to have Cushing disease given his normal blood pressure and electrolyte. His major problem seems to be obesity. His BMI is 33.   -Since his symptoms are very severe, will give him a referral to neurosurgeon for possible decompressive laminectomy or resection of epidural adipose tissue. -Will advice patient for weight loss.  -Will continue current gabapentin 300 mg 3 times a day and meloxicam 7.5 mg daily.   Lorretta Harp, MD PGY2, Internal Medicine Teaching Service Pager: [phone]

## 2012-09-06 NOTE — Patient Instructions (Addendum)
1. Please take Gabapentin 300 mg 3 times a day, and meloxicam 7.5 mg daily for you back pain. 2. Please follow up with your surgeon, Dr. Byrd Hesselbach as soon as possible. 3. If you have worsening of your symptoms or new symptoms arise, please call the clinic ([phone]), or go to the ER immediately if symptoms are severe.  You have done great job in taking all your medications. I appreciate it very much. Please continue doing that.

## 2012-09-07 ENCOUNTER — Other Ambulatory Visit: Payer: Self-pay | Admitting: Internal Medicine

## 2012-09-07 NOTE — Progress Notes (Signed)
I discussed the patient with resident Dr. Clyde Lundborg at the time of the visit.  I agree with the plans as outlined in his note.

## 2012-09-13 ENCOUNTER — Ambulatory Visit (HOSPITAL_COMMUNITY)
Admission: RE | Admit: 2012-09-13 | Discharge: 2012-09-13 | Disposition: A | Discharge status: Home / Self Care | Payer: Self-pay | Source: Ambulatory Visit | Attending: Internal Medicine | Admitting: Internal Medicine

## 2012-09-13 ENCOUNTER — Other Ambulatory Visit: Payer: Self-pay | Admitting: Internal Medicine

## 2012-09-13 DIAGNOSIS — C499 Malignant neoplasm of connective and soft tissue, unspecified: Secondary | ICD-10-CM

## 2012-09-13 DIAGNOSIS — M545 Low back pain, unspecified: Secondary | ICD-10-CM | POA: Insufficient documentation

## 2012-09-13 DIAGNOSIS — M549 Dorsalgia, unspecified: Secondary | ICD-10-CM

## 2012-09-13 MED ORDER — GADOBENATE DIMEGLUMINE 529 MG/ML IV SOLN
20.0000 mL | Freq: Once | INTRAVENOUS | Status: AC
  Administered 2012-09-13: 20 mL via INTRAVENOUS

## 2012-09-13 NOTE — Progress Notes (Signed)
Tasha in radiology called [employer] and requested a change in the MRI order to MRI of lumbar spine with and without contrast - order entered and signed as requested.

## 2012-09-19 ENCOUNTER — Telehealth: Payer: Self-pay | Admitting: *Deleted

## 2012-09-19 ENCOUNTER — Encounter: Payer: Self-pay | Admitting: *Deleted

## 2012-09-19 NOTE — Telephone Encounter (Signed)
Call to pt said that he went to see Dr. Byrd Hesselbach on Friday.  Call to the office previously pt was seen there on Friday.  Pt said that Dr. Byrd Hesselbach is going to remove a lump from his stomach and left breast on non Friday.  Pt said that Dr. Byrd Hesselbach said that he was going to check on the MRI that was done on 09/13/2012.  When asked if a referral to Neurosurgery was made pt said no mention of a referral.  Pt said that he was feeling well and is coming for an appointment next month.  Angelina Ok, RN 09/19/2012 4:40 PM.

## 2012-09-20 ENCOUNTER — Encounter: Payer: Self-pay | Admitting: Internal Medicine

## 2012-09-20 ENCOUNTER — Telehealth: Payer: Self-pay | Admitting: Internal Medicine

## 2012-09-20 ENCOUNTER — Other Ambulatory Visit: Payer: Self-pay | Admitting: Internal Medicine

## 2012-09-20 DIAGNOSIS — D1779 Benign lipomatous neoplasm of other sites: Secondary | ICD-10-CM

## 2012-09-20 NOTE — Telephone Encounter (Signed)
I called patient and explained his MRI-lumbar results. I suggested him to start working on loosing weight. He told me that he was already seen by his surgeon. He is going to do biopsy of the nodules on 09/23/12 by his surgeon. I told him that I will send him the MRI-result by letter and asked him to bring MRI result to his surgeon. I will to give him a referral to neurosurgeon ( I discussed this with Dr. Meredith Pel who agreed).   Lorretta Harp, MD PGY2, Internal Medicine Teaching Service Pager: [phone]

## 2012-09-26 ENCOUNTER — Telehealth: Payer: Self-pay | Admitting: *Deleted

## 2012-09-26 NOTE — Telephone Encounter (Signed)
Spoke to [place] at Dr. Byrd Hesselbach office read note from 09/22/2012 pt is to be referred to a Spinal Surgeon over at [facility] after pt's MRI was reviewed by Dr. Byrd Hesselbach..  Pt has not yet been scheduled by the schedulers.  Angelina Ok, RN 09/26/2012 11:18 AM

## 2012-10-06 ENCOUNTER — Encounter: Payer: Self-pay | Admitting: Internal Medicine

## 2012-10-27 ENCOUNTER — Encounter: Payer: Self-pay | Admitting: Internal Medicine

## 2016-04-15 ENCOUNTER — Emergency Department (HOSPITAL_COMMUNITY)
Admission: EM | Admit: 2016-04-15 | Discharge: 2016-04-15 | Disposition: A | Discharge status: Home / Self Care | Payer: Self-pay | Attending: Emergency Medicine | Admitting: Emergency Medicine

## 2016-04-15 ENCOUNTER — Encounter (HOSPITAL_COMMUNITY): Payer: Self-pay | Admitting: *Deleted

## 2016-04-15 ENCOUNTER — Emergency Department (HOSPITAL_COMMUNITY): Payer: Self-pay

## 2016-04-15 DIAGNOSIS — M533 Sacrococcygeal disorders, not elsewhere classified: Secondary | ICD-10-CM | POA: Insufficient documentation

## 2016-04-15 DIAGNOSIS — W228XXA Striking against or struck by other objects, initial encounter: Secondary | ICD-10-CM | POA: Insufficient documentation

## 2016-04-15 DIAGNOSIS — Y929 Unspecified place or not applicable: Secondary | ICD-10-CM | POA: Insufficient documentation

## 2016-04-15 DIAGNOSIS — Y999 Unspecified external cause status: Secondary | ICD-10-CM | POA: Insufficient documentation

## 2016-04-15 DIAGNOSIS — Z7982 Long term (current) use of aspirin: Secondary | ICD-10-CM | POA: Insufficient documentation

## 2016-04-15 DIAGNOSIS — F17200 Nicotine dependence, unspecified, uncomplicated: Secondary | ICD-10-CM | POA: Insufficient documentation

## 2016-04-15 DIAGNOSIS — R0781 Pleurodynia: Secondary | ICD-10-CM | POA: Insufficient documentation

## 2016-04-15 DIAGNOSIS — Y939 Activity, unspecified: Secondary | ICD-10-CM | POA: Insufficient documentation

## 2016-04-15 DIAGNOSIS — Z85831 Personal history of malignant neoplasm of soft tissue: Secondary | ICD-10-CM | POA: Insufficient documentation

## 2016-04-15 HISTORY — DX: Other pulmonary embolism without acute cor pulmonale: I26.99

## 2016-04-15 LAB — I-STAT CHEM 8, ED
BUN: 15 mg/dL (ref 6–20)
CALCIUM ION: 1.18 mmol/L (ref 1.15–1.40)
CHLORIDE: 102 mmol/L (ref 101–111)
Creatinine, Ser: 1.4 mg/dL — ABNORMAL HIGH (ref 0.61–1.24)
GLUCOSE: 105 mg/dL — AB (ref 65–99)
HCT: 54 % — ABNORMAL HIGH (ref 39.0–52.0)
HEMOGLOBIN: 18.4 g/dL — AB (ref 13.0–17.0)
POTASSIUM: 4.4 mmol/L (ref 3.5–5.1)
SODIUM: 142 mmol/L (ref 135–145)
TCO2: 27 mmol/L (ref 0–100)

## 2016-04-15 LAB — URINALYSIS, ROUTINE W REFLEX MICROSCOPIC
Bilirubin Urine: NEGATIVE
Glucose, UA: NEGATIVE mg/dL
Hgb urine dipstick: NEGATIVE
KETONES UR: NEGATIVE mg/dL
LEUKOCYTES UA: NEGATIVE
NITRITE: NEGATIVE
PH: 5 (ref 5.0–8.0)
Protein, ur: NEGATIVE mg/dL
SPECIFIC GRAVITY, URINE: 1.026 (ref 1.005–1.030)

## 2016-04-15 MED ORDER — OXYCODONE-ACETAMINOPHEN 5-325 MG PO TABS
1.0000 | ORAL_TABLET | ORAL | Status: AC | PRN
  Administered 2016-04-15 (×2): 1 via ORAL
  Filled 2016-04-15 (×2): qty 1

## 2016-04-15 MED ORDER — HYDROCODONE-ACETAMINOPHEN 5-325 MG PO TABS: 1.0000 | ORAL_TABLET | Freq: Four times a day (QID) | ORAL | 0 refills | Status: DC | PRN

## 2016-04-15 NOTE — ED Notes (Signed)
Pt ambulated to the restroom with ease. 

## 2016-04-15 NOTE — ED Triage Notes (Signed)
Pt reports falling two days ago and hit his back on a corner piece. Reports having coccyx pain and right posterior/rib flank pain. Pain increases when breathing and moving. Having difficulty with having bowel movement due to pain. Hx of PE and states this back/flank pain is similar to that pain.

## 2016-04-15 NOTE — ED Provider Notes (Signed)
[facility] DEPT Provider Note   CSN: [id number] Arrival date & time: 04/15/16  [id number]     History   Chief Complaint Chief Complaint  Patient presents with  . Fall  . Flank Pain    HPI Omar Cross is a 50 y.o. male with a past medical hx significant for fibrosarcoma, PE and sciatic pain who presents to the emergency department today after waking up with sharp right midaxilary lower rib pain. He reports a fall about 5 days ago in which he fell straight down on his "tailbone" on the corner of a shoe rack. He did not hit his head or anything else and had no loss of consciousness. He did not seek care at the time and was "waiting it out" as he felt the pain would eventually subside and he didn't believe anything could be done about it. He has been experiencing increased pain near his coccyx when defecating and at the end of urinating. No loss of bladder or bowel function, dysuria, hematuria, hematochezia. Yesterday he began having this right sided pain as he turned in bed and has been having difficulty finding a comfortable position. The pain is worse with palpation and deep inhalation. He finds comfort by lying still on his other side. He currently only takes gabapentin and has tried motrin for this pain with no relief. He denies fever, chills, nightsweats, unexplained weight loss, neuro-deficits. He does have a history of malignancy.  HPI  Past Medical History:  Diagnosis Date  . Cancer (HCC)    Fibrosarcoma  . Pulmonary embolism ([place])   . Sciatic pain     Patient Active Problem List   Diagnosis Date Noted  . 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 Back pain 09/06/2012    Past Surgical History:  Procedure Laterality Date  . ANKLE SURGERY         Home Medications    Prior to Admission medications   Medication Sig Start Date End Date Taking? Authorizing Provider  aspirin 325 MG tablet Take 325 mg by mouth every 6 (six) hours as needed.   Yes Historical Provider,  MD  ibuprofen (ADVIL,MOTRIN) 200 MG tablet Take 400 mg by mouth every 6 (six) hours as needed.   Yes Historical Provider, MD  gabapentin (NEURONTIN) 300 MG capsule Take 1 capsule (300 mg total) by mouth 3 (three) times daily. Patient not taking: Reported on 04/15/2016 09/06/12   Ivor Costa, MD  HYDROcodone-acetaminophen (NORCO/VICODIN) 5-325 MG tablet Take 1 tablet by mouth every 6 (six) hours as needed for moderate pain or severe pain. 04/15/16   Emeline General, PA  meloxicam (MOBIC) 7.5 MG tablet Take 1 tablet (7.5 mg total) by mouth daily. Patient not taking: Reported on 04/15/2016 09/06/12   Ivor Costa, MD  Multiple Vitamin (MULTIVITAMIN WITH MINERALS) TABS Take 1 tablet by mouth daily.    Historical Provider, MD   Patient reports only taking gabapentin at home and motrin since the fall for pain. He no longer takes meloxicam.  Family History History reviewed. No pertinent family history.  Social History Social History  Substance Use Topics  . Smoking status: Current Every Day Smoker    Packs/day: 0.50  . Smokeless tobacco: Not on file     Comment: trying   . Alcohol use Yes     Comment: daily     Allergies   Nsaids   Review of Systems Review of Systems  Constitutional: Negative for appetite change, chills, diaphoresis, fever and unexpected weight change.  HENT:  Negative for congestion, ear discharge, ear pain, sinus pain, sinus pressure and sore throat.   Eyes: Negative for pain, discharge and itching.  Respiratory: Negative for cough, chest tightness, shortness of breath and stridor.   Cardiovascular: Negative for chest pain and leg swelling.  Gastrointestinal: Negative for abdominal distention, abdominal pain, anal bleeding, blood in stool, nausea and rectal pain.  Genitourinary: Negative for decreased urine volume, difficulty urinating, dysuria, frequency, hematuria, penile pain and urgency.  Musculoskeletal: Negative for back pain, gait problem, neck pain and neck  stiffness.       Patient endorses worsening of pain with twisting of his torso and deep inhalation  Skin: Negative for color change, pallor, rash and wound.  Neurological: Negative for dizziness, weakness, numbness and headaches.     Physical Exam Updated Vital Signs BP 103/58   Pulse 61   Temp 98 F (36.7 C)   Resp 18   SpO2 97%   Physical Exam  Constitutional: He is oriented to person, place, and time. He appears well-developed and well-nourished. No distress.  HENT:  Head: Atraumatic.  Eyes: EOM are normal. Right eye exhibits no discharge. Left eye exhibits no discharge.  Neck: Normal range of motion. Neck supple. No tracheal deviation present.  Cardiovascular: Normal rate, regular rhythm, normal heart sounds and intact distal pulses.   No murmur heard. Pulmonary/Chest: Effort normal and breath sounds normal. No stridor. No respiratory distress. He has no wheezes. He has no rales. He exhibits no tenderness.  Abdominal: Soft. Bowel sounds are normal. He exhibits no distension and no mass. There is no tenderness. There is no rebound and no guarding.  No CVA tenderness. Negative murphy's sign. No evidence of hepatomegaly.  Genitourinary:  Genitourinary Comments: Patient refused rectal exam. Risk and benefits discussed.  External visualization of anus showed no bleeding, tear, or evidence of abscess or hemorrhoids.  Musculoskeletal: Normal range of motion. He exhibits tenderness. He exhibits no edema or deformity.  Patient has marked tenderness with firm palpation of focal area in midaxillary lower ribs. He has full ROM. Flexion of spine, normal gait. No CVA tenderness, RUQ tenderness, spinous process tenderness. No back or flank tenderness.   Neurological: He is alert and oriented to person, place, and time. He displays normal reflexes. No sensory deficit. Coordination normal.  Patient was observed walking from his room to the restroom without apparent discomfort and normal  gait.  Patient refused rectal exam.  Skin: Skin is warm and dry. No rash noted. He is not diaphoretic. No erythema. No pallor.  Psychiatric: He has a normal mood and affect. His behavior is normal.  Nursing note and vitals reviewed.    ED Treatments / Results  Labs (all labs ordered are listed, but only abnormal results are displayed) Labs Reviewed  I-STAT CHEM 8, ED - Abnormal; Notable for the following:       Result Value   Creatinine, Ser 1.40 (*)    Glucose, Bld 105 (*)    Hemoglobin 18.4 (*)    HCT 54.0 (*)    All other components within normal limits  URINALYSIS, ROUTINE W REFLEX MICROSCOPIC (NOT AT [facility])    EKG  EKG Interpretation None       Radiology Dg Ribs Unilateral W/chest Right  Result Date: 04/15/2016 CLINICAL DATA:  Fall, right lower rib and lower back pain, initial encounter. EXAM: RIGHT RIBS AND CHEST - 3+ VIEW COMPARISON:  CT chest 04/20/2005 and chest radiograph 01/11/2005. FINDINGS: Trachea is midline. Heart size normal. Lungs are  clear. No pleural fluid. No rib fracture. IMPRESSION: Negative. Electronically Signed   By: Lorin Picket M.D.   On: 04/15/2016 11:30    Procedures Procedures (including critical care time)  Medications Ordered in ED Medications  oxyCODONE-acetaminophen (PERCOCET/ROXICET) 5-325 MG per tablet 1 tablet (1 tablet Oral Given 04/15/16 1238)     Initial Impression / Assessment and Plan / ED Course  I have reviewed the triage vital signs and the nursing notes.  Pertinent labs & imaging results that were available during my care of the patient were reviewed by me and considered in my medical decision making (see chart for details).  Clinical Course    Omar Cross is a 50 y.o. male with a past medical hx significant for fibrosarcoma, PE and sciatic pain who presents to the emergency department today after waking up with sharp right midaxilary lower rib pain. Chest xray showed no evidence of rib fracture.  Because  patient's pain in very focal, non-radiating and sharp in nature, worsened by palpation of lower ribs only in midaxillary region and because he denies chest pain, SOB, flank pain, CVA tenderness, RUQ tenderness. It is more likely that he intercostal muscle strain and I have low suspicion for PE, nephrolithiasis, or cholecystitis. He has also been sitting in a twisted position because of the coccygeal pain which may have contributed to his side pain. We discussed this and patient agrees with likely cause. Because of his creatinine, advised patient to discontinue NSAIDS over the counter. Because NSAIDS are not an option, will prescribe short course (3 days) of Norco with close follow up with [facility].  Regarding his continued coccygeal pain with defecation and urination, urine analysis was negative  and creatinine was slightly elevated at 1.4 on chemistries. Last creatinine on file was 1.18 3 years ago. Because of the patient's creatinine, patient was advised to avoid Nsaids and to reestablish primary care for further monitoring. Patient was much less concerned with this problem than his rib pain and would not have come in otherwise. I discussed with the patient the recommendation for a rectal exam to ensure no trauma, bleeding and good rectal tone, but patient refused. He states that he will come back if things get worse. I explained the benefits of the exam and patient understood the risk of forgoing the exam.  Patient requested a "shot" for his pain before he goes home. I explained that toradol would not be an option given his kidney function. Patient agrees with treatment plan and states that he will follow up with primary care.  Patient given strict return precautions.  Final Clinical Impressions(s) / ED Diagnoses   Final diagnoses:  Rib pain on right side  Coccyx pain    New Prescriptions Discharge Medication List as of 04/15/2016  4:17 PM    START taking these  medications   Details  HYDROcodone-acetaminophen (NORCO/VICODIN) 5-325 MG tablet Take 1 tablet by mouth every 6 (six) hours as needed for moderate pain or severe pain., Starting Wed 04/15/2016, [place], [place] 04/15/16 1706    Julianne Rice, MD 04/16/16 [phone]

## 2016-04-24 ENCOUNTER — Encounter (HOSPITAL_COMMUNITY): Payer: Self-pay

## 2016-04-24 ENCOUNTER — Emergency Department (HOSPITAL_COMMUNITY)
Admission: EM | Admit: 2016-04-24 | Discharge: 2016-04-24 | Disposition: A | Discharge status: Home / Self Care | Payer: Self-pay | Attending: Emergency Medicine | Admitting: Emergency Medicine

## 2016-04-24 DIAGNOSIS — M5441 Lumbago with sciatica, right side: Secondary | ICD-10-CM | POA: Insufficient documentation

## 2016-04-24 DIAGNOSIS — M5431 Sciatica, right side: Secondary | ICD-10-CM

## 2016-04-24 DIAGNOSIS — Z8589 Personal history of malignant neoplasm of other organs and systems: Secondary | ICD-10-CM | POA: Insufficient documentation

## 2016-04-24 DIAGNOSIS — F17200 Nicotine dependence, unspecified, uncomplicated: Secondary | ICD-10-CM | POA: Insufficient documentation

## 2016-04-24 DIAGNOSIS — Z7982 Long term (current) use of aspirin: Secondary | ICD-10-CM | POA: Insufficient documentation

## 2016-04-24 MED ORDER — ACETAMINOPHEN 325 MG PO TABS: 650.0000 mg | ORAL_TABLET | Freq: Four times a day (QID) | ORAL | 0 refills | Status: DC | PRN

## 2016-04-24 MED ORDER — PREDNISONE 20 MG PO TABS: 40.0000 mg | ORAL_TABLET | Freq: Every day | ORAL | 0 refills | Status: DC

## 2016-04-24 MED ORDER — METHOCARBAMOL 500 MG PO TABS: 500.0000 mg | ORAL_TABLET | Freq: Two times a day (BID) | ORAL | 0 refills | Status: DC | PRN

## 2016-04-24 MED ORDER — ACETAMINOPHEN 325 MG PO TABS
650.0000 mg | ORAL_TABLET | Freq: Once | ORAL | Status: AC
  Administered 2016-04-24: 650 mg via ORAL
  Filled 2016-04-24: qty 2

## 2016-04-24 MED ORDER — DIAZEPAM 5 MG PO TABS
5.0000 mg | ORAL_TABLET | Freq: Once | ORAL | Status: AC
  Administered 2016-04-24: 5 mg via ORAL
  Filled 2016-04-24: qty 1

## 2016-04-24 NOTE — ED Triage Notes (Signed)
Pt reports right side lumbar back pain. He reports the pain radiates down his leg. He reports trouble walking. Pt denies dysuria.

## 2016-04-24 NOTE — ED Provider Notes (Signed)
[facility] DEPT Provider Note    By signing my name below, I, Omar Cross, attest that this documentation has been prepared under the direction and in the presence of Omar Keyan Folson, PA-C. Electronically Signed: Bea Cross, ED Scribe. 04/24/16. 12:57 PM.   History   Chief Complaint Chief Complaint  Patient presents with  . Back Pain    HPI  HPI Comments:  Omar Cross is a 50 y.o. male with PMHx of sciatic nerve pain who presents to the Emergency Department complaining of right sided low back pain that began yesterday. He reports the pain radiates down the right leg. Pt states this feels like his normal sciatic pain. He has not taken anything for pain. Movements increase the pain. He denies alleviating factors. He denies fever, chills, nausea, vomiting, bowel or bladder incontinence, dysuria, hematuria, numbness, tingling or weakness of the lower extremities. He reports fibrosarcoma surgery which he reports led to the pain. He denies h/o IV drug use. He denies trauma, injury or fall. He does not have a PCP.   Past Medical History:  Diagnosis Date  . Cancer (HCC)    Fibrosarcoma  . Pulmonary embolism ([place])   . Sciatic pain     Patient Active Problem List   Diagnosis Date Noted  . 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 Back pain 09/06/2012    Past Surgical History:  Procedure Laterality Date  . ANKLE SURGERY         Home Medications    Prior to Admission medications   Medication Sig Start Date End Date Taking? Authorizing Provider  acetaminophen (TYLENOL) 325 MG tablet Take 2 tablets (650 mg total) by mouth every 6 (six) hours as needed for mild pain or moderate pain. 04/24/16   Waynetta Pean, PA-C  aspirin 325 MG tablet Take 325 mg by mouth every 6 (six) hours as needed.    Historical Provider, MD  gabapentin (NEURONTIN) 300 MG capsule Take 1 capsule (300 mg total) by mouth 3 (three) times daily. Patient not taking: Reported on 04/15/2016  09/06/12   Ivor Costa, MD  methocarbamol (ROBAXIN) 500 MG tablet Take 1 tablet (500 mg total) by mouth 2 (two) times daily as needed for muscle spasms. 04/24/16   Waynetta Pean, PA-C  Multiple Vitamin (MULTIVITAMIN WITH MINERALS) TABS Take 1 tablet by mouth daily.    Historical Provider, MD  predniSONE (DELTASONE) 20 MG tablet Take 2 tablets (40 mg total) by mouth daily. 04/24/16   Waynetta Pean, PA-C    Family History History reviewed. No pertinent family history.  Social History Social History  Substance Use Topics  . Smoking status: Current Every Day Smoker    Packs/day: 0.50  . Smokeless tobacco: Never Used     Comment: trying   . Alcohol use Yes     Comment: daily     Allergies   Nsaids   Review of Systems Review of Systems  Constitutional: Negative for chills and fever.  Cardiovascular: Negative for leg swelling.  Gastrointestinal: Negative for nausea and vomiting.  Genitourinary: Negative for difficulty urinating, dysuria and hematuria.       No bowel or bladder incontinence  Musculoskeletal: Positive for back pain.  Skin: Negative for rash and wound.  Neurological: Negative for weakness and numbness.     Physical Exam Updated Vital Signs BP 105/82 (BP Location: Left Arm)   Pulse 75   Temp 98.2 F (36.8 C) (Oral)   Resp 16   SpO2 100%   Physical Exam  Constitutional:  He appears well-developed and well-nourished. No distress.  Nontoxic-appearing.  HENT:  Head: Normocephalic and atraumatic.  Eyes: Right eye exhibits no discharge. Left eye exhibits no discharge.  Cardiovascular: Normal rate, regular rhythm and intact distal pulses.   Bilateral posterior tibialis pulses are intact.  Pulmonary/Chest: Effort normal. No respiratory distress.  Musculoskeletal: He exhibits tenderness. He exhibits no edema or deformity.  Tenderness to palpation to low right back musculature. No midline neck or back tenderness. No lower extremity edema or tenderness. No back  erythema, deformity, ecchymosis or warmth.  Neurological: He is alert. He displays normal reflexes. Coordination normal.  Good strength with plantar and dorsiflexion. Antalgic gait. PT DTRs intact bilaterally.  Skin: Skin is warm and dry. Capillary refill takes less than 2 seconds. No rash noted. He is not diaphoretic. No erythema. No pallor.  Psychiatric: He has a normal mood and affect. His behavior is normal.  Nursing note and vitals reviewed.    ED Treatments / Results  DIAGNOSTIC STUDIES: Oxygen Saturation is 100% on RA, normal by my interpretation.   COORDINATION OF CARE: 12:51 PM- Omar prescribe Robaxin, Tylenol and Prednisone. Omar give referral to [facility] and back exercises. Pt verbalizes understanding and agrees to plan.  Medications  diazepam (VALIUM) tablet 5 mg (not administered)  acetaminophen (TYLENOL) tablet 650 mg (not administered)    Labs (all labs ordered are listed, but only abnormal results are displayed) Labs Reviewed - No data to display  EKG  EKG Interpretation None       Radiology No results found.  Procedures Procedures (including critical care time)  Medications Ordered in ED Medications  diazepam (VALIUM) tablet 5 mg (not administered)  acetaminophen (TYLENOL) tablet 650 mg (not administered)     Initial Impression / Assessment and Plan / ED Course  I have reviewed the triage vital signs and the nursing notes.  Pertinent labs & imaging results that were available during my care of the patient were reviewed by me and considered in my medical decision making (see chart for details).  Clinical Course     Patient with low back pain radiating down RLE that began yesterday. This is consistent with his previous sciatica pain.  No neurological deficits and normal neuro exam.  Patient is ambulatory.  No loss of bowel or bladder control.  No concern for cauda equina.  No fever, night sweats, weight loss, h/o cancer, IVDA, no recent  procedure to back. No urinary symptoms suggestive of UTI.  Supportive care and return precaution discussed. We'll discharge with Tylenol, Robaxin and short course of steroids as the patient has had recent slight elevation in his creatinine. Omar not prescribe steroids. Appears safe for discharge at this time. Follow up as indicated in discharge paperwork.    I personally performed the services described in this documentation, which was scribed in my presence. The recorded information has been reviewed and is accurate.     Final Clinical Impressions(s) / ED Diagnoses   Final diagnoses:  Sciatica of right side    New Prescriptions New Prescriptions   ACETAMINOPHEN (TYLENOL) 325 MG TABLET    Take 2 tablets (650 mg total) by mouth every 6 (six) hours as needed for mild pain or moderate pain.   METHOCARBAMOL (ROBAXIN) 500 MG TABLET    Take 1 tablet (500 mg total) by mouth 2 (two) times daily as needed for muscle spasms.   PREDNISONE (DELTASONE) 20 MG TABLET    Take 2 tablets (40 mg total) by mouth daily.  Waynetta Pean, PA-C 04/24/16 1307    Dorie Rank, MD 04/25/16 0800

## 2017-03-03 ENCOUNTER — Emergency Department (HOSPITAL_COMMUNITY): Payer: Self-pay

## 2017-03-03 ENCOUNTER — Emergency Department (HOSPITAL_COMMUNITY)
Admission: EM | Admit: 2017-03-03 | Discharge: 2017-03-03 | Disposition: A | Discharge status: Home / Self Care | Payer: Self-pay | Attending: Emergency Medicine | Admitting: Emergency Medicine

## 2017-03-03 ENCOUNTER — Encounter (HOSPITAL_COMMUNITY): Payer: Self-pay | Admitting: *Deleted

## 2017-03-03 DIAGNOSIS — M25522 Pain in left elbow: Secondary | ICD-10-CM | POA: Insufficient documentation

## 2017-03-03 DIAGNOSIS — R0789 Other chest pain: Secondary | ICD-10-CM | POA: Insufficient documentation

## 2017-03-03 DIAGNOSIS — Z79899 Other long term (current) drug therapy: Secondary | ICD-10-CM | POA: Insufficient documentation

## 2017-03-03 DIAGNOSIS — F17210 Nicotine dependence, cigarettes, uncomplicated: Secondary | ICD-10-CM | POA: Insufficient documentation

## 2017-03-03 LAB — HEPATIC FUNCTION PANEL
ALK PHOS: 62 U/L (ref 38–126)
ALT: 30 U/L (ref 17–63)
AST: 32 U/L (ref 15–41)
Albumin: 3.3 g/dL — ABNORMAL LOW (ref 3.5–5.0)
Total Bilirubin: 0.4 mg/dL (ref 0.3–1.2)
Total Protein: 6.1 g/dL — ABNORMAL LOW (ref 6.5–8.1)

## 2017-03-03 LAB — CBC
HCT: 42.7 % (ref 39.0–52.0)
Hemoglobin: 14.9 g/dL (ref 13.0–17.0)
MCH: 32.6 pg (ref 26.0–34.0)
MCHC: 34.9 g/dL (ref 30.0–36.0)
MCV: 93.4 fL (ref 78.0–100.0)
Platelets: 300 10*3/uL (ref 150–400)
RBC: 4.57 MIL/uL (ref 4.22–5.81)
RDW: 14.2 % (ref 11.5–15.5)
WBC: 7.7 10*3/uL (ref 4.0–10.5)

## 2017-03-03 LAB — BASIC METABOLIC PANEL
Anion gap: 7 (ref 5–15)
BUN: 15 mg/dL (ref 6–20)
CO2: 21 mmol/L — ABNORMAL LOW (ref 22–32)
Calcium: 8.5 mg/dL — ABNORMAL LOW (ref 8.9–10.3)
Chloride: 112 mmol/L — ABNORMAL HIGH (ref 101–111)
Creatinine, Ser: 1.12 mg/dL (ref 0.61–1.24)
GFR calc Af Amer: >60 mL/min (ref >60)
GLUCOSE: 126 mg/dL — AB (ref 65–99)
POTASSIUM: 4.1 mmol/L (ref 3.5–5.1)
Sodium: 140 mmol/L (ref 135–145)

## 2017-03-03 LAB — I-STAT TROPONIN, ED
Troponin i, poc: 0.01 ng/mL (ref 0.00–0.08)
Troponin i, poc: 0 ng/mL (ref 0.00–0.08)

## 2017-03-03 MED ORDER — FENTANYL CITRATE (PF) 100 MCG/2ML IJ SOLN
50.0000 ug | Freq: Once | INTRAMUSCULAR | Status: AC
  Administered 2017-03-03: 50 ug via INTRAVENOUS
  Filled 2017-03-03: qty 2

## 2017-03-03 MED ORDER — IBUPROFEN 400 MG PO TABS: 400.0000 mg | ORAL_TABLET | Freq: Four times a day (QID) | ORAL | 0 refills | Status: DC | PRN

## 2017-03-03 MED ORDER — HYDROCODONE-ACETAMINOPHEN 5-325 MG PO TABS: 2.0000 | ORAL_TABLET | Freq: Four times a day (QID) | ORAL | 0 refills | Status: DC | PRN

## 2017-03-03 MED ORDER — IOPAMIDOL (ISOVUE-370) INJECTION 76%
INTRAVENOUS | Status: AC
  Administered 2017-03-03: 100 mL via INTRAVENOUS
  Filled 2017-03-03: qty 100

## 2017-03-03 MED ORDER — IOPAMIDOL (ISOVUE-370) INJECTION 76%
100.0000 mL | Freq: Once | INTRAVENOUS | Status: AC | PRN
  Administered 2017-03-03: 100 mL via INTRAVENOUS

## 2017-03-03 MED FILL — HYDROCODON-APAP 5-325: 5-325 | 1 days supply | Qty: 6 | Fill #0

## 2017-03-03 NOTE — ED Provider Notes (Signed)
[facility] DEPT Provider Note   CSN: [id number] Arrival date & time: 03/03/17  6237     History   Chief Complaint Chief Complaint  Patient presents with  . Chest Pain    HPI Omar Cross is a 51 y.o. male.  HPI Patient presents with left-sided chest pain. Began earlier today rather acutely. States he was just sitting down. States the pain is in his left chest and worse with breathing. Also worse with certain movements. Does not sterilely feel short of breath however it is painful to breathe. Also pain down his left arm. States it is worse when he tries to contract his hand. The arm pain has been going on for a while is not necessarily associated with the chest pain. 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 Has not followed up recently with [facility] for this. Previous history of pulmonary embolus some's. States he had had pulmonary embolus was 2 different times. One was after he had a surgery the other one was after that when he was no longer immobilized. Had been on anticoagulation but he stopped it because he was told he could only drink 24 ounces of beer a day. States he drinks more than this. Last drink yesterday. He also smokes cigarettes. Past Medical History:  Diagnosis Date  . Cancer (HCC)    Fibrosarcoma  . Pulmonary embolism ([place])   . Sciatic pain     Patient Active Problem List   Diagnosis Date Noted  . 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 Back pain 09/06/2012    Past Surgical History:  Procedure Laterality Date  . ANKLE SURGERY         Home Medications    Prior to Admission medications   Medication Sig Start Date End Date Taking? Authorizing Provider  acetaminophen (TYLENOL) 500 MG tablet Take 2,000 mg by mouth every 6 (six) hours as needed for moderate pain.   Yes [provider]  gabapentin (NEURONTIN) 400 MG capsule Take 400 mg by mouth 3 (three) times daily.   Yes [provider]    Multiple Vitamin (MULTIVITAMIN WITH MINERALS) TABS Take 1 tablet by mouth daily.   Yes [provider]  Omega-3 Fatty Acids (FISH OIL) 1000 MG CAPS Take 2 capsules by mouth daily.   Yes [provider]  acetaminophen (TYLENOL) 325 MG tablet Take 2 tablets (650 mg total) by mouth every 6 (six) hours as needed for mild pain or moderate pain. Patient not taking: Reported on 03/03/2017 04/24/16   Waynetta Pean, PA-C  gabapentin (NEURONTIN) 300 MG capsule Take 1 capsule (300 mg total) by mouth 3 (three) times daily. Patient not taking: Reported on 04/15/2016 09/06/12   Ivor Costa, MD  HYDROcodone-acetaminophen (NORCO/VICODIN) 5-325 MG tablet Take 2 tablets by mouth every 6 (six) hours as needed. 03/03/17   Davonna Belling, MD  ibuprofen (ADVIL,MOTRIN) 400 MG tablet Take 1 tablet (400 mg total) by mouth every 6 (six) hours as needed. 03/03/17   Davonna Belling, MD  methocarbamol (ROBAXIN) 500 MG tablet Take 1 tablet (500 mg total) by mouth 2 (two) times daily as needed for muscle spasms. Patient not taking: Reported on 03/03/2017 04/24/16   Waynetta Pean, PA-C  predniSONE (DELTASONE) 20 MG tablet Take 2 tablets (40 mg total) by mouth daily. Patient not taking: Reported on 03/03/2017 04/24/16   Waynetta Pean, PA-C    Family History No family history on file.  Social History Social History  Substance Use Topics  . Smoking status: Current Every Day Smoker    Packs/day: 0.50  . Smokeless tobacco: Never Used     Comment: trying   . Alcohol use Yes     Comment: daily     Allergies   Patient has no known allergies.   Review of Systems Review of Systems  Constitutional: Negative for appetite change.  HENT: Negative for congestion.   Respiratory: Positive for shortness of breath.   Cardiovascular: Positive for chest pain.  Gastrointestinal: Negative for abdominal pain.  Genitourinary: Negative for flank pain.  Musculoskeletal: Negative for back pain.       Left arm  pain  Neurological: Negative for tremors and numbness.  Psychiatric/Behavioral: Negative for confusion.     Physical Exam Updated Vital Signs BP (!) 133/99 (BP Location: Right Arm)   Pulse 62   Temp 98.1 F (36.7 C) (Oral)   Resp 15   Ht 6\' 2"  (1.88 m)   Wt 97.1 kg (214 lb)   SpO2 100%   BMI 27.48 kg/m   Physical Exam  Constitutional: He appears well-developed.  HENT:  Head: Atraumatic.  Eyes: Scleral icterus is present.  Neck: Neck supple.  Cardiovascular: Normal rate.   Pulmonary/Chest: Effort normal. He exhibits tenderness.  Tenderness left lateral lower chest wall. Along this dermatome there is a approximate 5 cm mass  just lateral to paraspinal area.  Abdominal: There is no tenderness.  Musculoskeletal:  Tenderness on palpation of left elbow. Particularly posteriorly and medially. Pain with contracting his hand. No skin changes on his forearm. Good capillary refill and radial pulse and hand. Sensation intact. No edema of his lower extremities.  Neurological: He is alert.  Skin: Skin is warm. Capillary refill takes less than 2 seconds.  Psychiatric: He has a normal mood and affect.     ED Treatments / Results  Labs (all labs ordered are listed, but only abnormal results are displayed) Labs Reviewed  BASIC METABOLIC PANEL - Abnormal; Notable for the following:       Result Value   Chloride 112 (*)    CO2 21 (*)    Glucose, Bld 126 (*)    Calcium 8.5 (*)    All other components within normal limits  HEPATIC FUNCTION PANEL - Abnormal; Notable for the following:    Total Protein 6.1 (*)    Albumin 3.3 (*)    Bilirubin, Direct <0.1 (*)    All other components within normal limits  CBC  I-STAT TROPONIN, ED  I-STAT TROPONIN, ED    EKG  EKG Interpretation  Date/Time:  Wednesday March 03 2017 08:58:52 EDT Ventricular Rate:  77 PR Interval:    QRS Duration: 95 QT Interval:  386 QTC Calculation: 437 R Axis:   9 Text Interpretation:  Sinus rhythm Abnormal  R-wave progression, early transition Borderline ST elevation, lateral leads T waves more peaked compared to 2006 Confirmed by Davonna Belling [id number]) on 03/03/2017 9:13:23 AM       Radiology Dg Chest 2 View  Result Date: 03/03/2017 CLINICAL DATA:  Chest pain EXAM: CHEST  2 VIEW COMPARISON:  April 15, 2016 FINDINGS: Lungs are clear. Heart size and pulmonary vascularity are normal. No adenopathy. No pneumothorax. No bone lesions. IMPRESSION: No edema or consolidation. Electronically Signed   By: Lowella Grip III M.D.   On: 03/03/2017 09:35   Dg Elbow Complete Left  Result Date: 03/03/2017 CLINICAL DATA:  Elbow pain. EXAM: LEFT ELBOW - COMPLETE 3+ VIEW COMPARISON:  None.  FINDINGS: There is no evidence of fracture, dislocation, or joint effusion. There is no evidence of arthropathy or other focal bone abnormality. Soft tissues are unremarkable. IMPRESSION: Normal radiographs Electronically Signed   By: Nelson Chimes M.D.   On: 03/03/2017 12:46   Ct Angio Chest Pe W And/or Wo Contrast  Result Date: 03/03/2017 CLINICAL DATA:  Left-sided chest pain radiating to the left arm. EXAM: CT ANGIOGRAPHY CHEST WITH CONTRAST TECHNIQUE: Multidetector CT imaging of the chest was performed using the standard protocol during bolus administration of intravenous contrast. Multiplanar CT image reconstructions and MIPs were obtained to evaluate the vascular anatomy. CONTRAST:  100 mL Isovue 370 COMPARISON:  Chest radiographs 03/03/2017.  Chest CTA 04/20/2005. FINDINGS: Cardiovascular: Pulmonary arterial opacification is adequate without definite segmental or more proximal emboli allowing for mild respiratory motion artifact. The thoracic aorta is normal in caliber. The heart is normal in size. There is no pericardial effusion. Mediastinum/Nodes: No enlarged axillary, mediastinal, or hilar lymph nodes. Unremarkable esophagus and thyroid. Lungs/Pleura: No pleural effusion or pneumothorax. Mild paraseptal emphysema in  the upper lobes with mild apical scarring. Dependent subsegmental atelectasis in both lower lobes. No mass. Upper Abdomen: Unremarkable. Musculoskeletal: No acute osseous abnormality or suspicious osseous lesion. Review of the MIP images confirms the above findings. IMPRESSION: 1. No evidence of pulmonary emboli or other acute abnormality in the chest. 2. Emphysema (ICD10-J43.9). Electronically Signed   By: Logan Bores M.D.   On: 03/03/2017 10:43    Procedures Procedures (including critical care time)  Medications Ordered in ED Medications  fentaNYL (SUBLIMAZE) injection 50 mcg (50 mcg Intravenous Given 03/03/17 1004)  iopamidol (ISOVUE-370) 76 % injection 100 mL (100 mLs Intravenous Contrast Given 03/03/17 1019)     Initial Impression / Assessment and Plan / ED Course  I have reviewed the triage vital signs and the nursing notes.  Pertinent labs & imaging results that were available during my care of the patient were reviewed by me and considered in my medical decision making (see chart for details).     Patient with chest pain. Pleuritic left-sided. Also left elbow pain. CT angiography done due to previous pulmonary embolism. No PE. Enzymes negative 2 for heart. EKG overall reassuring. Patient later told me that he had been shoveling a lot and wonders if that could be his arm and elbow pain. Likely muscle cell component. Will discharge home.  Final Clinical Impressions(s) / ED Diagnoses   Final diagnoses:  Chest wall pain  Left elbow pain    New Prescriptions Discharge Medication List as of 03/03/2017  3:03 PM    START taking these medications   Details  HYDROcodone-acetaminophen (NORCO/VICODIN) 5-325 MG tablet Take 2 tablets by mouth every 6 (six) hours as needed., Starting Wed 03/03/2017, Print    ibuprofen (ADVIL,MOTRIN) 400 MG tablet Take 1 tablet (400 mg total) by mouth every 6 (six) hours as needed., Starting Wed 03/03/2017, Print         Davonna Belling, MD 03/04/17  [phone]

## 2017-03-03 NOTE — ED Triage Notes (Signed)
Patient is aleart and oriented x4.  Patient is from home with complaints of left sided chest pain with with radiation to the left arm.

## 2017-03-03 NOTE — Discharge Instructions (Signed)
Take it easy on the elbow. Follow-up with a primary care doctor.

## 2017-03-03 NOTE — ED Notes (Signed)
Patient transported to CT 

## 2017-03-04 ENCOUNTER — Emergency Department (HOSPITAL_COMMUNITY): Payer: Self-pay

## 2017-03-04 ENCOUNTER — Encounter (HOSPITAL_COMMUNITY): Payer: Self-pay | Admitting: Emergency Medicine

## 2017-03-04 ENCOUNTER — Emergency Department (HOSPITAL_COMMUNITY)
Admission: EM | Admit: 2017-03-04 | Discharge: 2017-03-04 | Disposition: A | Discharge status: Home / Self Care | Payer: Self-pay | Attending: Emergency Medicine | Admitting: Emergency Medicine

## 2017-03-04 DIAGNOSIS — F17210 Nicotine dependence, cigarettes, uncomplicated: Secondary | ICD-10-CM | POA: Insufficient documentation

## 2017-03-04 DIAGNOSIS — M542 Cervicalgia: Secondary | ICD-10-CM | POA: Insufficient documentation

## 2017-03-04 DIAGNOSIS — M25522 Pain in left elbow: Secondary | ICD-10-CM | POA: Insufficient documentation

## 2017-03-04 DIAGNOSIS — M25511 Pain in right shoulder: Secondary | ICD-10-CM | POA: Insufficient documentation

## 2017-03-04 DIAGNOSIS — Z86711 Personal history of pulmonary embolism: Secondary | ICD-10-CM | POA: Insufficient documentation

## 2017-03-04 LAB — COMPREHENSIVE METABOLIC PANEL
ALK PHOS: 64 U/L (ref 38–126)
ALT: 31 U/L (ref 17–63)
AST: 28 U/L (ref 15–41)
Albumin: 3.4 g/dL — ABNORMAL LOW (ref 3.5–5.0)
Anion gap: 5 (ref 5–15)
BILIRUBIN TOTAL: 0.7 mg/dL (ref 0.3–1.2)
BUN: 9 mg/dL (ref 6–20)
CHLORIDE: 107 mmol/L (ref 101–111)
CO2: 25 mmol/L (ref 22–32)
CREATININE: 1.04 mg/dL (ref 0.61–1.24)
Calcium: 8.6 mg/dL — ABNORMAL LOW (ref 8.9–10.3)
GFR calc Af Amer: >60 mL/min (ref >60)
Glucose, Bld: 93 mg/dL (ref 65–99)
Potassium: 5 mmol/L (ref 3.5–5.1)
Sodium: 137 mmol/L (ref 135–145)
Total Protein: 6.2 g/dL — ABNORMAL LOW (ref 6.5–8.1)

## 2017-03-04 LAB — CBC
HEMATOCRIT: 45.6 % (ref 39.0–52.0)
HEMOGLOBIN: 14.6 g/dL (ref 13.0–17.0)
MCH: 30.2 pg (ref 26.0–34.0)
MCHC: 32 g/dL (ref 30.0–36.0)
MCV: 94.4 fL (ref 78.0–100.0)
PLATELETS: 300 10*3/uL (ref 150–400)
RBC: 4.83 MIL/uL (ref 4.22–5.81)
RDW: 14.5 % (ref 11.5–15.5)
WBC: 8 10*3/uL (ref 4.0–10.5)

## 2017-03-04 LAB — PROTIME-INR
INR: 0.86
PROTHROMBIN TIME: 11.6 s (ref 11.4–15.2)

## 2017-03-04 LAB — I-STAT CHEM 8, ED
BUN: 13 mg/dL (ref 6–20)
CREATININE: 1 mg/dL (ref 0.61–1.24)
Calcium, Ion: 1.15 mmol/L (ref 1.15–1.40)
Chloride: 106 mmol/L (ref 101–111)
Glucose, Bld: 89 mg/dL (ref 65–99)
HEMATOCRIT: 49 % (ref 39.0–52.0)
Hemoglobin: 16.7 g/dL (ref 13.0–17.0)
POTASSIUM: 4.9 mmol/L (ref 3.5–5.1)
Sodium: 140 mmol/L (ref 135–145)
TCO2: 27 mmol/L (ref 22–32)

## 2017-03-04 LAB — URINALYSIS, ROUTINE W REFLEX MICROSCOPIC
Bilirubin Urine: NEGATIVE
GLUCOSE, UA: NEGATIVE mg/dL
Hgb urine dipstick: NEGATIVE
Ketones, ur: NEGATIVE mg/dL
LEUKOCYTES UA: NEGATIVE
NITRITE: NEGATIVE
PH: 5 (ref 5.0–8.0)
Protein, ur: NEGATIVE mg/dL
SPECIFIC GRAVITY, URINE: 1.009 (ref 1.005–1.030)

## 2017-03-04 LAB — RAPID URINE DRUG SCREEN, HOSP PERFORMED
AMPHETAMINES: NOT DETECTED
BENZODIAZEPINES: NOT DETECTED
Barbiturates: NOT DETECTED
COCAINE: NOT DETECTED
OPIATES: POSITIVE — AB
Tetrahydrocannabinol: NOT DETECTED

## 2017-03-04 LAB — I-STAT CG4 LACTIC ACID, ED: Lactic Acid, Venous: 1.12 mmol/L (ref 0.5–1.9)

## 2017-03-04 LAB — ETHANOL: Alcohol, Ethyl (B): <10 mg/dL (ref <10)

## 2017-03-04 MED ORDER — OXYCODONE-ACETAMINOPHEN 5-325 MG PO TABS
2.0000 | ORAL_TABLET | Freq: Once | ORAL | Status: AC
  Administered 2017-03-04: 2 via ORAL
  Filled 2017-03-04: qty 2

## 2017-03-04 MED ORDER — OXYCODONE-ACETAMINOPHEN 5-325 MG PO TABS
1.0000 | ORAL_TABLET | Freq: Once | ORAL | Status: DC
Start: 2017-03-04 — End: 2017-03-04
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
## 2017-03-04 NOTE — ED Notes (Signed)
Patient given discharge instructions and verbalized understanding.  Patient stable to discharge at this time.  Patient is alert and oriented to baseline.  No distressed noted at this time.  All belongings taken with the patient at discharge.  Signature pad broke,  pt verbalized understanding discharge instructions.

## 2017-03-04 NOTE — ED Notes (Addendum)
Pt ambulated to bathroom independently

## 2017-03-04 NOTE — Discharge Instructions (Signed)
Take Motrin and/or Tylenol as needed for pain.  Follow-up with your PCP as needed.
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
## 2017-03-04 NOTE — ED Notes (Signed)
MD at bedside. 

## 2017-03-04 NOTE — ED Notes (Signed)
Pt went to CT

## 2017-03-04 NOTE — ED Notes (Signed)
Fast exam by MD is negative.

## 2017-03-08 ENCOUNTER — Emergency Department (HOSPITAL_COMMUNITY)
Admission: EM | Admit: 2017-03-08 | Discharge: 2017-03-08 | Disposition: A | Discharge status: Home / Self Care | Payer: Self-pay | Attending: Emergency Medicine | Admitting: Emergency Medicine

## 2017-03-08 DIAGNOSIS — M79604 Pain in right leg: Secondary | ICD-10-CM | POA: Insufficient documentation

## 2017-03-08 DIAGNOSIS — Y9389 Activity, other specified: Secondary | ICD-10-CM | POA: Insufficient documentation

## 2017-03-08 DIAGNOSIS — Z79899 Other long term (current) drug therapy: Secondary | ICD-10-CM | POA: Insufficient documentation

## 2017-03-08 DIAGNOSIS — Y92410 Unspecified street and highway as the place of occurrence of the external cause: Secondary | ICD-10-CM | POA: Insufficient documentation

## 2017-03-08 DIAGNOSIS — M79602 Pain in left arm: Secondary | ICD-10-CM | POA: Insufficient documentation

## 2017-03-08 DIAGNOSIS — Y999 Unspecified external cause status: Secondary | ICD-10-CM | POA: Insufficient documentation

## 2017-03-08 DIAGNOSIS — F17210 Nicotine dependence, cigarettes, uncomplicated: Secondary | ICD-10-CM | POA: Insufficient documentation

## 2017-03-08 DIAGNOSIS — Z85831 Personal history of malignant neoplasm of soft tissue: Secondary | ICD-10-CM | POA: Insufficient documentation

## 2017-03-08 MED ORDER — HYDROCODONE-ACETAMINOPHEN 5-325 MG PO TABS
1.0000 | ORAL_TABLET | Freq: Once | ORAL | Status: AC
  Administered 2017-03-08: 1 via ORAL
  Filled 2017-03-08: qty 1

## 2017-03-08 MED ORDER — MELOXICAM 7.5 MG PO TABS: 7.5000 mg | ORAL_TABLET | Freq: Every day | ORAL | 0 refills | Status: DC

## 2017-03-08 MED ORDER — CYCLOBENZAPRINE HCL 10 MG PO TABS: 10.0000 mg | ORAL_TABLET | Freq: Two times a day (BID) | ORAL | 0 refills | Status: DC | PRN

## 2017-03-08 NOTE — Discharge Instructions (Signed)
Please read instructions below. Apply ice to your areas of pain for 20 minutes at a time. You can also apply heat followed by gentle massage and stretching. You can take mobic every once daily with meals as needed for pain. You can take flexeril as needed for muscle spasm. This mediation can make you drowsy, so do not drive while taking. You may have a mild concussion. Avoid strenuous brain activity, such as complex thinking. Limit your screen time. Drink plenty of water. Schedule an appointment with your primary care provider is symptoms persist. Return to the ER for severely worsening headache, vision changes, uncontrollable vomiting, or new or concerning symptoms.
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
## 2017-03-09 MED FILL — CYCLOBENZAPRINE 10 MG TAB: 10 | 10 days supply | Qty: 20 | Fill #0

## 2017-03-09 MED FILL — MELOXICAM 7.5 MG TABLET: 7.5 | 15 days supply | Qty: 15 | Fill #0

## 2017-07-14 ENCOUNTER — Emergency Department (HOSPITAL_COMMUNITY): Payer: Self-pay

## 2017-07-14 ENCOUNTER — Encounter (HOSPITAL_COMMUNITY): Payer: Self-pay

## 2017-07-14 ENCOUNTER — Other Ambulatory Visit: Payer: Self-pay

## 2017-07-14 ENCOUNTER — Emergency Department (HOSPITAL_COMMUNITY)
Admission: EM | Admit: 2017-07-14 | Discharge: 2017-07-14 | Disposition: A | Discharge status: Home / Self Care | Payer: Self-pay | Attending: Emergency Medicine | Admitting: Emergency Medicine

## 2017-07-14 DIAGNOSIS — Z79899 Other long term (current) drug therapy: Secondary | ICD-10-CM | POA: Insufficient documentation

## 2017-07-14 DIAGNOSIS — J111 Influenza due to unidentified influenza virus with other respiratory manifestations: Secondary | ICD-10-CM

## 2017-07-14 DIAGNOSIS — F17200 Nicotine dependence, unspecified, uncomplicated: Secondary | ICD-10-CM | POA: Insufficient documentation

## 2017-07-14 DIAGNOSIS — Z85831 Personal history of malignant neoplasm of soft tissue: Secondary | ICD-10-CM | POA: Insufficient documentation

## 2017-07-14 DIAGNOSIS — R509 Fever, unspecified: Secondary | ICD-10-CM | POA: Insufficient documentation

## 2017-07-14 DIAGNOSIS — R05 Cough: Secondary | ICD-10-CM | POA: Insufficient documentation

## 2017-07-14 DIAGNOSIS — Z86711 Personal history of pulmonary embolism: Secondary | ICD-10-CM | POA: Insufficient documentation

## 2017-07-14 DIAGNOSIS — R69 Illness, unspecified: Secondary | ICD-10-CM | POA: Insufficient documentation

## 2017-07-14 DIAGNOSIS — J40 Bronchitis, not specified as acute or chronic: Secondary | ICD-10-CM | POA: Insufficient documentation

## 2017-07-14 DIAGNOSIS — R0981 Nasal congestion: Secondary | ICD-10-CM | POA: Insufficient documentation

## 2017-07-14 MED ORDER — DOXYCYCLINE HYCLATE 100 MG PO CAPS: 100.0000 mg | ORAL_CAPSULE | Freq: Two times a day (BID) | ORAL | 0 refills | Status: DC

## 2017-07-14 MED ORDER — ALBUTEROL SULFATE HFA 108 (90 BASE) MCG/ACT IN AERS
2.0000 | INHALATION_SPRAY | Freq: Once | RESPIRATORY_TRACT | Status: AC
Start: 2017-07-14 — End: 2017-07-14
  Administered 2017-07-14: 2 via RESPIRATORY_TRACT
  Filled 2017-07-14: qty 6.7

## 2017-07-14 MED ORDER — OSELTAMIVIR PHOSPHATE 75 MG PO CAPS: 75.0000 mg | ORAL_CAPSULE | Freq: Two times a day (BID) | ORAL | 0 refills | Status: DC

## 2017-07-14 NOTE — ED Notes (Signed)
Pt is here with cough that started last night and then this am had some blood when he coughed up.  Reports fever for a couple of days

## 2017-07-14 NOTE — ED Provider Notes (Signed)
[facility] Provider Note   CSN: [id number] Arrival date & time: 07/14/17  6387     History   Chief Complaint Chief Complaint  Patient presents with  . Hemoptysis    HPI Omar Cross is a 52 y.o. male.  HPI  Patient presenting with complaint of fever cough and congestion for the past 2 days.  Last night the cough worsened and this morning he saw some blood when he was coughing.  He has not had any vomiting.  He has no shortness of breath.  He states the fever has resolved with the cough is worsening.  No chest pain.  He has been taking Tylenol and ibuprofen for his symptoms.  He does smoke.  He has no specific sick contacts.  He did not receive his flu vaccine this year.  There are no other associated systemic symptoms, there are no other alleviating or modifying factors.   Past Medical History:  Diagnosis Date  . Cancer (HCC)    Fibrosarcoma  . Pulmonary embolism ([place])   . Sciatic pain     Patient Active Problem List   Diagnosis Date Noted  . 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 Back pain 09/06/2012    Past Surgical History:  Procedure Laterality Date  . ANKLE SURGERY         Home Medications    Prior to Admission medications   Medication Sig Start Date End Date Taking? Authorizing Provider  Multiple Vitamin (MULTIVITAMIN WITH MINERALS) TABS Take 1 tablet by mouth daily.   Yes [provider]  zinc sulfate 220 (50 Zn) MG capsule Take 220 mg by mouth daily.   Yes [provider]  acetaminophen (TYLENOL) 325 MG tablet Take 2 tablets (650 mg total) by mouth every 6 (six) hours as needed for mild pain or moderate pain. Patient not taking: Reported on 03/03/2017 04/24/16   Waynetta Pean, PA-C  acetaminophen (TYLENOL) 500 MG tablet Take 2,000 mg by mouth every 6 (six) hours as needed for moderate pain.    [provider]  cyclobenzaprine (FLEXERIL) 10 MG tablet Take 1 tablet (10 mg total) by  mouth 2 (two) times daily as needed for muscle spasms. Patient not taking: Reported on 07/14/2017 03/08/17   Robinson, [place] N, PA-C  doxycycline (VIBRAMYCIN) 100 MG capsule Take 1 capsule (100 mg total) by mouth 2 (two) times daily. 07/14/17   Mabe, Forbes Cellar, MD  gabapentin (NEURONTIN) 300 MG capsule Take 1 capsule (300 mg total) by mouth 3 (three) times daily. Patient not taking: Reported on 04/15/2016 09/06/12   Ivor Costa, MD  gabapentin (NEURONTIN) 400 MG capsule Take 400 mg by mouth 3 (three) times daily.    [provider]  HYDROcodone-acetaminophen (NORCO/VICODIN) 5-325 MG tablet Take 2 tablets by mouth every 6 (six) hours as needed. Patient not taking: Reported on 07/14/2017 03/03/17   Davonna Belling, MD  ibuprofen (ADVIL,MOTRIN) 400 MG tablet Take 1 tablet (400 mg total) by mouth every 6 (six) hours as needed. Patient not taking: Reported on 07/14/2017 03/03/17   Davonna Belling, MD  meloxicam (MOBIC) 7.5 MG tablet Take 1 tablet (7.5 mg total) by mouth daily. Patient not taking: Reported on 07/14/2017 03/08/17   Robinson, [place] N, PA-C  methocarbamol (ROBAXIN) 500 MG tablet Take 1 tablet (500 mg total) by mouth 2 (two) times daily as needed for muscle spasms. Patient not taking: Reported on 03/03/2017 04/24/16   Waynetta Pean, PA-C  Omega-3 Fatty Acids (FISH OIL) 1000  MG CAPS Take 2 capsules by mouth daily.    [provider]  oseltamivir (TAMIFLU) 75 MG capsule Take 1 capsule (75 mg total) by mouth every 12 (twelve) hours. 07/14/17   Mabe, Forbes Cellar, MD  predniSONE (DELTASONE) 20 MG tablet Take 2 tablets (40 mg total) by mouth daily. Patient not taking: Reported on 03/03/2017 04/24/16   Waynetta Pean, PA-C    Family History No family history on file.  Social History Social History   Tobacco Use  . Smoking status: Current Every Day Smoker    Packs/day: 0.50  . Smokeless tobacco: Never Used  . Tobacco comment: trying   Substance Use Topics  . Alcohol use: Yes     Comment: daily  . Drug use: No     Allergies   Patient has no known allergies.   Review of Systems Review of Systems  ROS reviewed and all otherwise negative except for mentioned in HPI   Physical Exam Updated Vital Signs BP 127/81 (BP Location: Right Arm)   Pulse 85   Temp 99.1 F (37.3 C)   Resp 16   Ht 6\' 2"  (1.88 m)   Wt 104.3 kg (230 lb)   SpO2 100%   BMI 29.53 kg/m  Vitals reviewed Physical Exam  Physical Examination: General appearance - alert, well appearing, and in no distress Mental status - alert, oriented to person, place, and time Eyes - no conjunctival injection, no scleral icterus Mouth - mucous membranes moist, pharynx normal without lesions Neck - supple, no significant adenopathy Chest - clear to auscultation, no wheezes, rales or rhonchi, symmetric air entry, normal respiratory effort Heart - normal rate, regular rhythm, normal S1, S2, no murmurs, rubs, clicks or gallops Abdomen - soft, nontender, nondistended, no masses or organomegaly Neurological - alert, oriented, normal speech Extremities - peripheral pulses normal, no pedal edema, no clubbing or cyanosis Skin - normal coloration and turgor, no rashes   ED Treatments / Results  Labs (all labs ordered are listed, but only abnormal results are displayed) Labs Reviewed - No data to display  EKG  EKG Interpretation None       Radiology Dg Chest 2 View  Result Date: 07/14/2017 CLINICAL DATA:  Hemoptysis. Flu like symptoms with cough for 4 days. History of pulmonary embolism and fibrosarcoma per patient. EXAM: CHEST  2 VIEW COMPARISON:  Radiographs 03/04/2017 and 03/03/2017.  CT 03/03/2017. FINDINGS: The heart size and mediastinal contours are stable. There is mild central airway thickening, but no confluent airspace opacity, pleural effusion or pneumothorax. The bones appear unremarkable. IMPRESSION: Mild chronic central airway thickening. No acute cardiopulmonary process. Electronically  Signed   By: Richardean Sale M.D.   On: 07/14/2017 10:00    Procedures Procedures (including critical care time)  Medications Ordered in ED Medications  albuterol (PROVENTIL HFA;VENTOLIN HFA) 108 (90 Base) MCG/ACT inhaler 2 puff (2 puffs Inhalation Given 07/14/17 1258)     Initial Impression / Assessment and Plan / ED Course  I have reviewed the triage vital signs and the nursing notes.  Pertinent labs & imaging results that were available during my care of the patient were reviewed by me and considered in my medical decision making (see chart for details).     Patient presenting with complaint of cough congestion and fever which began 2 days ago.  Fever has resolved and he has been coughing and this morning coughed up some blood.  He has not on any blood thinners, he has no shortness of breath,  his chest x-ray was most consistent with bronchitis.  Patient is a smoker and was advised to stop smoking.  Patient given albuterol inhaler in the ED and will discharge with prescription for Tamiflu and doxycycline.  Discharged with strict return precautions.  Pt agreeable with plan.  Final Clinical Impressions(s) / ED Diagnoses   Final diagnoses:  Influenza-like illness  Bronchitis    ED Discharge Orders        Ordered    oseltamivir (TAMIFLU) 75 MG capsule  Every 12 hours     07/14/17 1226    doxycycline (VIBRAMYCIN) 100 MG capsule  2 times daily     07/14/17 1226       Mabe, Forbes Cellar, MD 07/14/17 1419

## 2017-07-14 NOTE — ED Triage Notes (Addendum)
Per Pt, Pt is coming from home with complaints of coughing up blood that started this morning. Pt has been sick for four days with flu-like symptoms with dry cough. Denies any CP or SOB

## 2017-07-14 NOTE — Discharge Instructions (Signed)
Return to the ED with any concerns including difficulty breathing despite using albuterol every 4 hours, not drinking fluids, decreased urine output, vomiting and not able to keep down liquids or medications, decreased level of alertness/lethargy, or any other alarming symptoms °
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
## 2019-01-09 ENCOUNTER — Encounter: Payer: Self-pay | Admitting: Internal Medicine

## 2019-01-09 ENCOUNTER — Other Ambulatory Visit: Payer: Self-pay

## 2019-01-09 ENCOUNTER — Ambulatory Visit: Payer: Self-pay | Admitting: Internal Medicine

## 2019-01-09 VITALS — BP 102/78 | HR 78 | Resp 12 | Ht 73.0 in | Wt 245.0 lb

## 2019-01-09 DIAGNOSIS — F329 Major depressive disorder, single episode, unspecified: Secondary | ICD-10-CM

## 2019-01-09 DIAGNOSIS — Z716 Tobacco abuse counseling: Secondary | ICD-10-CM

## 2019-01-09 DIAGNOSIS — Z7289 Other problems related to lifestyle: Secondary | ICD-10-CM

## 2019-01-09 DIAGNOSIS — Z720 Tobacco use: Secondary | ICD-10-CM

## 2019-01-09 DIAGNOSIS — F32A Depression, unspecified: Secondary | ICD-10-CM

## 2019-01-09 DIAGNOSIS — Z6832 Body mass index (BMI) 32.0-32.9, adult: Secondary | ICD-10-CM

## 2019-01-09 DIAGNOSIS — N529 Male erectile dysfunction, unspecified: Secondary | ICD-10-CM

## 2019-01-09 DIAGNOSIS — M5416 Radiculopathy, lumbar region: Secondary | ICD-10-CM

## 2019-01-09 DIAGNOSIS — E6609 Other obesity due to excess calories: Secondary | ICD-10-CM

## 2019-01-09 DIAGNOSIS — Z789 Other specified health status: Secondary | ICD-10-CM

## 2019-01-09 MED ORDER — PREDNISONE 10 MG PO TABS: ORAL_TABLET | ORAL | 0 refills | Status: DC
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
## 2019-01-09 NOTE — Patient Instructions (Addendum)
Tobacco Cessation:   1800QUITNOW or [phone], the former for support and possibly free nicotine patches/gum and support; the latter for [facility] Smoking cessation class. Get rid of all smoking supplies:  Cigarettes, lighters, ashtrays--no stashes just in case at home if you are serious.  Let me know if you would like to try the nicotine patches or gum for a prescription of either or both if unable to get for free from the Quit line above  Stop beer and liquor intake to see if helps with ED  Please sign up for orange card so can get you and MRI of spine

## 2019-01-17 ENCOUNTER — Other Ambulatory Visit: Payer: Self-pay

## 2019-01-23 ENCOUNTER — Other Ambulatory Visit: Payer: Self-pay

## 2019-01-23 ENCOUNTER — Other Ambulatory Visit (INDEPENDENT_AMBULATORY_CARE_PROVIDER_SITE_OTHER): Payer: Self-pay

## 2019-01-23 DIAGNOSIS — Z79899 Other long term (current) drug therapy: Secondary | ICD-10-CM

## 2019-01-23 DIAGNOSIS — Z125 Encounter for screening for malignant neoplasm of prostate: Secondary | ICD-10-CM

## 2019-01-23 DIAGNOSIS — Z13220 Encounter for screening for lipoid disorders: Secondary | ICD-10-CM

## 2019-01-24 LAB — CBC WITH DIFFERENTIAL/PLATELET
Basophils Absolute: 0.1 10*3/uL (ref 0.0–0.2)
Basos: 1 %
EOS (ABSOLUTE): 0.2 10*3/uL (ref 0.0–0.4)
Eos: 2 %
Hematocrit: 45.1 % (ref 37.5–51.0)
Hemoglobin: 15 g/dL (ref 13.0–17.7)
Immature Grans (Abs): 0 10*3/uL (ref 0.0–0.1)
Immature Granulocytes: 1 %
Lymphocytes Absolute: 3.3 10*3/uL — ABNORMAL HIGH (ref 0.7–3.1)
Lymphs: 40 %
MCH: 31.8 pg (ref 26.6–33.0)
MCHC: 33.3 g/dL (ref 31.5–35.7)
MCV: 96 fL (ref 79–97)
Monocytes Absolute: 0.6 10*3/uL (ref 0.1–0.9)
Monocytes: 8 %
Neutrophils Absolute: 4.1 10*3/uL (ref 1.4–7.0)
Neutrophils: 48 %
Platelets: 377 10*3/uL (ref 150–450)
RBC: 4.71 x10E6/uL (ref 4.14–5.80)
RDW: 13.2 % (ref 11.6–15.4)
WBC: 8.3 10*3/uL (ref 3.4–10.8)

## 2019-01-24 LAB — COMPREHENSIVE METABOLIC PANEL
ALT: 69 IU/L — ABNORMAL HIGH (ref 0–44)
AST: 33 IU/L (ref 0–40)
Albumin/Globulin Ratio: 1.9 (ref 1.2–2.2)
Albumin: 4.3 g/dL (ref 3.8–4.9)
Alkaline Phosphatase: 74 IU/L (ref 39–117)
BUN/Creatinine Ratio: 15 (ref 9–20)
BUN: 17 mg/dL (ref 6–24)
Bilirubin Total: 0.4 mg/dL (ref 0.0–1.2)
CO2: 23 mmol/L (ref 20–29)
Calcium: 9 mg/dL (ref 8.7–10.2)
Chloride: 104 mmol/L (ref 96–106)
Creatinine, Ser: 1.1 mg/dL (ref 0.76–1.27)
GFR calc Af Amer: 89 mL/min/{1.73_m2} (ref >59)
GFR calc non Af Amer: 77 mL/min/{1.73_m2} (ref >59)
Globulin, Total: 2.3 g/dL (ref 1.5–4.5)
Glucose: 95 mg/dL (ref 65–99)
Potassium: 5.2 mmol/L (ref 3.5–5.2)
Sodium: 143 mmol/L (ref 134–144)
Total Protein: 6.6 g/dL (ref 6.0–8.5)

## 2019-01-24 LAB — LIPID PANEL W/O CHOL/HDL RATIO
Cholesterol, Total: 286 mg/dL — ABNORMAL HIGH (ref 100–199)
HDL: 53 mg/dL (ref >39)
LDL Calculated: 180 mg/dL — ABNORMAL HIGH (ref 0–99)
Triglycerides: 265 mg/dL — ABNORMAL HIGH (ref 0–149)
VLDL Cholesterol Cal: 53 mg/dL — ABNORMAL HIGH (ref 5–40)

## 2019-01-24 LAB — PSA: Prostate Specific Ag, Serum: 0.4 ng/mL (ref 0.0–4.0)

## 2019-01-27 ENCOUNTER — Other Ambulatory Visit: Payer: Self-pay

## 2019-01-27 MED ORDER — SIMVASTATIN 40 MG PO TABS: ORAL_TABLET | ORAL | 11 refills | Status: DC

## 2019-03-10 ENCOUNTER — Other Ambulatory Visit: Payer: Self-pay

## 2019-04-16 DIAGNOSIS — F1010 Alcohol abuse, uncomplicated: Secondary | ICD-10-CM | POA: Insufficient documentation

## 2019-04-16 DIAGNOSIS — N529 Male erectile dysfunction, unspecified: Secondary | ICD-10-CM | POA: Insufficient documentation

## 2019-04-16 DIAGNOSIS — E6609 Other obesity due to excess calories: Secondary | ICD-10-CM | POA: Insufficient documentation

## 2019-04-16 DIAGNOSIS — F32A Depression, unspecified: Secondary | ICD-10-CM | POA: Insufficient documentation

## 2019-04-16 DIAGNOSIS — F329 Major depressive disorder, single episode, unspecified: Secondary | ICD-10-CM | POA: Insufficient documentation

## 2019-04-16 DIAGNOSIS — Z789 Other specified health status: Secondary | ICD-10-CM | POA: Insufficient documentation

## 2019-04-16 DIAGNOSIS — M5416 Radiculopathy, lumbar region: Secondary | ICD-10-CM | POA: Insufficient documentation

## 2019-04-16 DIAGNOSIS — Z720 Tobacco use: Secondary | ICD-10-CM | POA: Insufficient documentation

## 2019-04-16 DIAGNOSIS — Z7289 Other problems related to lifestyle: Secondary | ICD-10-CM | POA: Insufficient documentation

## 2019-04-16 DIAGNOSIS — Z6832 Body mass index (BMI) 32.0-32.9, adult: Secondary | ICD-10-CM | POA: Insufficient documentation

## 2019-04-18 ENCOUNTER — Ambulatory Visit: Payer: Self-pay | Admitting: Internal Medicine

## 2020-01-29 ENCOUNTER — Ambulatory Visit (HOSPITAL_COMMUNITY)
Admission: EM | Admit: 2020-01-29 | Discharge: 2020-01-29 | Disposition: A | Discharge status: Home / Self Care | Payer: Self-pay | Attending: Family Medicine | Admitting: Family Medicine

## 2020-01-29 ENCOUNTER — Other Ambulatory Visit: Payer: Self-pay

## 2020-01-29 ENCOUNTER — Encounter (HOSPITAL_COMMUNITY): Payer: Self-pay | Admitting: Emergency Medicine

## 2020-01-29 DIAGNOSIS — M795 Residual foreign body in soft tissue: Secondary | ICD-10-CM

## 2020-01-29 DIAGNOSIS — M79672 Pain in left foot: Secondary | ICD-10-CM
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
## 2020-01-29 NOTE — ED Triage Notes (Signed)
Pt presents with left heel pain xs 1 days. Denies any cause for pain, fall, or injury. States heel of foot is throbbing while sitting and worst with walking.   States took tylenol did not give any relief.

## 2020-01-29 NOTE — ED Provider Notes (Signed)
[place]    CSN: [id number] Arrival date & time: 01/29/20  1006      History   Chief Complaint Chief Complaint  Patient presents with  . Foot Pain    HPI Omar Cross is a 54 y.o. male with a history of fibrosarcoma s/p surgical removal and PE presenting with left heel pain for the past day.  Denies any preceding injury or fall, however started suddenly after he applied pressure on his foot yesterday evening.  Pain appears to be in the central of his heel and throbbing in nature.  Severe when walking/standing on area, " like I'm standing on a rock".  Has been using his wife's cane to offload pressure due to the pain.  No associated weakness, leg swelling, bruising, rash.  Has never had this before.  No increase of exercise regimen or repetitive activities recently.  Does a lot of walking for his job, needs work note.    Past Medical History:  Diagnosis Date  . Cancer (HCC)    Fibrosarcoma  . Pulmonary embolism ([place])   . Sciatic pain     Patient Active Problem List   Diagnosis Date Noted  . Erectile dysfunction 04/16/2019  . Class 1 obesity due to excess calories with body mass index (BMI) of 32.0 to 32.9 in adult 04/16/2019  . Alcohol use 04/16/2019  . Tobacco abuse 04/16/2019  . Depression 04/16/2019  . Right lumbar radiculopathy 04/16/2019  . 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 Back pain 09/06/2012    Past Surgical History:  Procedure Laterality Date  . ANKLE SURGERY    . excision of fibrosarcoma,low back    . lipoma surgeries         Home Medications    Prior to Admission medications   Medication Sig Start Date End Date Taking? Authorizing Provider  acetaminophen (TYLENOL) 500 MG tablet Take 2,000 mg by mouth every 6 (six) hours as needed for moderate pain.    [provider]  Cholecalciferol (VITAMIN D3) 50 MCG (2000 UT) TABS Take by mouth. 1 daily    [provider]  diphenhydrAMINE (BENADRYL) 25 MG tablet  Take 25 mg by mouth every 6 (six) hours as needed.    [provider]  ibuprofen (ADVIL) 600 MG tablet TAKE 1 TABLET BY MOUTH EVERY 6 HOURS AS NEEDED FOR UP TO 10 DAYS 12/07/18   [provider]  Multiple Vitamin (MULTIVITAMIN WITH MINERALS) TABS Take 1 tablet by mouth daily.    [provider]  Omega-3 Fatty Acids (FISH OIL) 1000 MG CAPS Take 2 capsules by mouth daily.    [provider]  predniSONE (DELTASONE) 10 MG tablet 3 tabs by mouth daily for 5 day then decrease by 5 mg or 1/2 tab daily until off 01/09/19   Mack Hook, MD  simvastatin (ZOCOR) 40 MG tablet 1 by mouth daily with evening meal. 01/27/19   Mack Hook, MD  tiZANidine (ZANAFLEX) 4 MG tablet Take 4 mg by mouth every 8 (eight) hours as needed for muscle spasms.    [provider]    Family History Family History  Problem Relation Age of Onset  . Hypertension Mother   . Healthy Father     Social History Social History   Tobacco Use  . Smoking status: Current Every Day Smoker    Packs/day: 0.50    Years: 34.00    Pack years: 17.00    Types: Cigarettes  . Smokeless tobacco: Never Used  Vaping  Use  . Vaping Use: Never used  Substance Use Topics  . Alcohol use: Yes    Comment: Half a pint of liquor once monthly with stress.  Drinks three 40 oz  beers daily.  . Drug use: Not Currently    Types: "Crack" cocaine    Comment: Last used in 2017     Allergies   Patient has no known allergies.   Review of Systems Review of Systems  Constitutional: Negative for chills, fatigue and fever.  Musculoskeletal: Positive for gait problem. Negative for arthralgias, joint swelling and myalgias.  Skin: Negative for rash.  Neurological: Negative for weakness and numbness.    Physical Exam Triage Vital Signs ED Triage Vitals  Enc Vitals Group     BP 01/29/20 1203 (!) 126/97     Pulse Rate 01/29/20 1203 91     Resp 01/29/20 1203 18     Temp 01/29/20 1203 98.6 F  (37 C)     Temp Source 01/29/20 1203 Oral     SpO2 01/29/20 1203 98 %     Weight --      Height --      Head Circumference --      Peak Flow --      Pain Score 01/29/20 1200 10     Pain Loc --      Pain Edu? --      Excl. in [place]? --    No data found.  Updated Vital Signs BP (!) 126/97 (BP Location: Left Arm)   Pulse 91   Temp 98.6 F (37 C) (Oral)   Resp 18   SpO2 98%    Physical Exam Constitutional:      General: He is not in acute distress.    Appearance: Normal appearance. He is not ill-appearing.  HENT:     Head: Normocephalic and atraumatic.  Pulmonary:     Effort: Pulmonary effort is normal.  Abdominal:     Palpations: Abdomen is soft.     Hernia: A hernia is present.     Comments: Soft, reducible small hernia palpated within umbilicus without any overlying erythema or discoloration  Musculoskeletal:     Right lower leg: No edema.     Left lower leg: No edema.     Comments: No obvious bruising, erythema, swelling, or deformity to left foot/heel. No tenderness with palpation of feet bilaterally, specifically over left heel.  Significant pain reproduced in middle heel upon standing.  5/5 lower extremity strength bilaterally.  With light, appreciated a small reflective foreign body within left heel at location of pain. Retrieved a 3 mm piece of glass with forceps.  Briefly dissected the area with forceps only without any palpation of glasslike material remaining.  Irrigated with saline.  Skin:    General: Skin is warm and dry.  Neurological:     Mental Status: He is alert.     UC Treatments / Results  Labs (all labs ordered are listed, but only abnormal results are displayed) Labs Reviewed - No data to display  EKG   Radiology No results found.  Procedures Foreign Body Removal  Date/Time: 01/29/2020 2:49 PM Performed by: Patriciaann Clan, DO Authorized by: Vanessa Kick, MD   Consent:    Consent obtained:  Verbal   Consent given by:  Patient    Risks discussed:  Bleeding, infection, pain and worsening of condition Location:    Location:  Foot   Foot location:  L heel Anesthesia (see MAR for exact  dosages):    Anesthesia method:  None Procedure details:    Removal mechanism:  Forceps   Foreign bodies recovered:  1   Intact foreign body removal: yes   Post-procedure details:    Confirmation:  No additional foreign bodies on visualization   Skin closure:  None   Dressing:  Open (no dressing)   Patient tolerance of procedure:  Tolerated well, no immediate complications   (including critical care time)  Medications Ordered in UC Medications - No data to display  Initial Impression / Assessment and Plan / UC Course  I have reviewed the triage vital signs and the nursing notes.  Pertinent labs & imaging results that were available during my care of the patient were reviewed by me and considered in my medical decision making (see chart for details).   54 year old gentleman presenting for evaluation of acute severe left heel pain starting yesterday evening.  Through exam, noted foreign body at location of pain and was able to retrieve a small piece of glass.  Unfortunately, he continued to have a similar pain upon standing, but suspect he will continue to have some soreness as this heals.  Did not appreciate any further glass through exam, however may be an occult piece. Provided with general surgery information if not improving in the next few days for further dissection/investigation.  Placed in a cam boot for pressure relief.  Tylenol/water soaks PRN discomfort.   Additionally briefly mentioned hernia within his umbilicus.  Asymptomatic. Soft and reducible.  RTC if symptomatic or unable to reduce, can follow-up with general surgery to discuss possible surgical intervention if desired.  Final Clinical Impressions(s) / UC Diagnoses   Final diagnoses:  Foot pain, left  Foreign body (FB) in soft tissue     Discharge Instructions      It was wonderful to see you today.  We believe that your foot pain is from the glass found in your foot, hopefully all was removed and will continue to be sore for the next few days.  However, it is possible that not all was removed and may need a more in-depth investigation if pain is persistent.  You can purchase donut shaped heel supports for your shoe to help offload pressure.  Tylenol/ibuprofen every 4-6 hours to help with any discomfort.  We have provided general surgery contact information as above if pain still significant.    ED Prescriptions    None     PDMP not reviewed this encounter.   Patriciaann Clan, DO 01/29/20 1454

## 2020-04-30 ENCOUNTER — Emergency Department (HOSPITAL_COMMUNITY): Payer: Self-pay

## 2020-04-30 ENCOUNTER — Encounter (HOSPITAL_COMMUNITY): Payer: Self-pay | Admitting: Emergency Medicine

## 2020-04-30 ENCOUNTER — Emergency Department (HOSPITAL_COMMUNITY)
Admission: EM | Admit: 2020-04-30 | Discharge: 2020-04-30 | Disposition: A | Discharge status: Home / Self Care | Payer: Self-pay | Attending: Emergency Medicine | Admitting: Emergency Medicine

## 2020-04-30 ENCOUNTER — Other Ambulatory Visit: Payer: Self-pay

## 2020-04-30 DIAGNOSIS — M545 Low back pain, unspecified: Secondary | ICD-10-CM | POA: Insufficient documentation

## 2020-04-30 DIAGNOSIS — K2920 Alcoholic gastritis without bleeding: Secondary | ICD-10-CM | POA: Insufficient documentation

## 2020-04-30 DIAGNOSIS — R0602 Shortness of breath: Secondary | ICD-10-CM | POA: Insufficient documentation

## 2020-04-30 DIAGNOSIS — Z859 Personal history of malignant neoplasm, unspecified: Secondary | ICD-10-CM | POA: Insufficient documentation

## 2020-04-30 DIAGNOSIS — Z20822 Contact with and (suspected) exposure to covid-19: Secondary | ICD-10-CM | POA: Insufficient documentation

## 2020-04-30 DIAGNOSIS — R079 Chest pain, unspecified: Secondary | ICD-10-CM | POA: Insufficient documentation

## 2020-04-30 DIAGNOSIS — F17210 Nicotine dependence, cigarettes, uncomplicated: Secondary | ICD-10-CM | POA: Insufficient documentation

## 2020-04-30 DIAGNOSIS — R0789 Other chest pain: Secondary | ICD-10-CM

## 2020-04-30 DIAGNOSIS — Z79899 Other long term (current) drug therapy: Secondary | ICD-10-CM | POA: Insufficient documentation

## 2020-04-30 LAB — BASIC METABOLIC PANEL
Anion gap: 13 (ref 5–15)
BUN: 16 mg/dL (ref 6–20)
CO2: 20 mmol/L — ABNORMAL LOW (ref 22–32)
Calcium: 8.6 mg/dL — ABNORMAL LOW (ref 8.9–10.3)
Chloride: 103 mmol/L (ref 98–111)
Creatinine, Ser: 1.36 mg/dL — ABNORMAL HIGH (ref 0.61–1.24)
GFR, Estimated: >60 mL/min (ref >60)
Glucose, Bld: 115 mg/dL — ABNORMAL HIGH (ref 70–99)
Potassium: 3.5 mmol/L (ref 3.5–5.1)
Sodium: 136 mmol/L (ref 135–145)

## 2020-04-30 LAB — CBC WITH DIFFERENTIAL/PLATELET
Abs Immature Granulocytes: 0.09 10*3/uL — ABNORMAL HIGH (ref 0.00–0.07)
Basophils Absolute: 0 10*3/uL (ref 0.0–0.1)
Basophils Relative: 1 %
Eosinophils Absolute: 0.2 10*3/uL (ref 0.0–0.5)
Eosinophils Relative: 2 %
HCT: 41.7 % (ref 39.0–52.0)
Hemoglobin: 13.6 g/dL (ref 13.0–17.0)
Immature Granulocytes: 1 %
Lymphocytes Relative: 59 %
Lymphs Abs: 4.5 10*3/uL — ABNORMAL HIGH (ref 0.7–4.0)
MCH: 31.1 pg (ref 26.0–34.0)
MCHC: 32.6 g/dL (ref 30.0–36.0)
MCV: 95.2 fL (ref 80.0–100.0)
Monocytes Absolute: 0.7 10*3/uL (ref 0.1–1.0)
Monocytes Relative: 9 %
Neutro Abs: 2.1 10*3/uL (ref 1.7–7.7)
Neutrophils Relative %: 28 %
Platelets: 279 10*3/uL (ref 150–400)
RBC: 4.38 MIL/uL (ref 4.22–5.81)
RDW: 13.6 % (ref 11.5–15.5)
WBC: 7.7 10*3/uL (ref 4.0–10.5)
nRBC: 0 % (ref 0.0–0.2)

## 2020-04-30 LAB — TROPONIN I (HIGH SENSITIVITY)
Troponin I (High Sensitivity): 4 ng/L (ref <18)
Troponin I (High Sensitivity): 3 ng/L (ref <18)

## 2020-04-30 LAB — I-STAT CHEM 8, ED
BUN: 17 mg/dL (ref 6–20)
Calcium, Ion: 1.07 mmol/L — ABNORMAL LOW (ref 1.15–1.40)
Chloride: 106 mmol/L (ref 98–111)
Creatinine, Ser: 1.4 mg/dL — ABNORMAL HIGH (ref 0.61–1.24)
Glucose, Bld: 110 mg/dL — ABNORMAL HIGH (ref 70–99)
HCT: 42 % (ref 39.0–52.0)
Hemoglobin: 14.3 g/dL (ref 13.0–17.0)
Potassium: 3.5 mmol/L (ref 3.5–5.1)
Sodium: 139 mmol/L (ref 135–145)
TCO2: 20 mmol/L — ABNORMAL LOW (ref 22–32)

## 2020-04-30 LAB — TYPE AND SCREEN
ABO/RH(D): A POS
Antibody Screen: NEGATIVE

## 2020-04-30 LAB — PROTIME-INR
INR: 1 (ref 0.8–1.2)
Prothrombin Time: 12.6 seconds (ref 11.4–15.2)

## 2020-04-30 LAB — HEPATIC FUNCTION PANEL
ALT: 58 U/L — ABNORMAL HIGH (ref 0–44)
AST: 36 U/L (ref 15–41)
Albumin: 3.5 g/dL (ref 3.5–5.0)
Alkaline Phosphatase: 61 U/L (ref 38–126)
Bilirubin, Direct: <0.1 mg/dL (ref 0.0–0.2)
Total Bilirubin: 0.4 mg/dL (ref 0.3–1.2)
Total Protein: 6.1 g/dL — ABNORMAL LOW (ref 6.5–8.1)

## 2020-04-30 LAB — LACTIC ACID, PLASMA
Lactic Acid, Venous: 2.3 mmol/L (ref 0.5–1.9)
Lactic Acid, Venous: 1.7 mmol/L (ref 0.5–1.9)

## 2020-04-30 LAB — RESP PANEL BY RT-PCR (FLU A&B, COVID) ARPGX2
Influenza A by PCR: NEGATIVE
Influenza B by PCR: NEGATIVE
SARS Coronavirus 2 by RT PCR: NEGATIVE

## 2020-04-30 LAB — ETHANOL: Alcohol, Ethyl (B): 69 mg/dL — ABNORMAL HIGH (ref <10)

## 2020-04-30 LAB — LIPASE, BLOOD: Lipase: 40 U/L (ref 11–51)

## 2020-04-30 MED ORDER — OMEPRAZOLE 20 MG PO CPDR: 20.0000 mg | DELAYED_RELEASE_CAPSULE | Freq: Every day | ORAL | 0 refills | Status: DC

## 2020-04-30 MED ORDER — FENTANYL CITRATE (PF) 100 MCG/2ML IJ SOLN
INTRAMUSCULAR | Status: AC
  Administered 2020-04-30: 50 ug via INTRAVENOUS
  Filled 2020-04-30: qty 2

## 2020-04-30 MED ORDER — IOHEXOL 350 MG/ML SOLN
100.0000 mL | Freq: Once | INTRAVENOUS | Status: AC | PRN
  Administered 2020-04-30: 100 mL via INTRAVENOUS

## 2020-04-30 MED ORDER — PANTOPRAZOLE SODIUM 20 MG PO TBEC
20.0000 mg | DELAYED_RELEASE_TABLET | Freq: Once | ORAL | Status: AC
  Administered 2020-04-30: 20 mg via ORAL
  Filled 2020-04-30 (×2): qty 1

## 2020-04-30 MED ORDER — ALUM & MAG HYDROXIDE-SIMETH 200-200-20 MG/5ML PO SUSP
30.0000 mL | Freq: Once | ORAL | Status: AC
  Administered 2020-04-30: 30 mL via ORAL
  Filled 2020-04-30: qty 30

## 2020-04-30 MED ORDER — SODIUM CHLORIDE 0.9 % IV BOLUS
1000.0000 mL | Freq: Once | INTRAVENOUS | Status: AC
  Administered 2020-04-30: 1000 mL via INTRAVENOUS

## 2020-04-30 MED ORDER — FENTANYL CITRATE (PF) 100 MCG/2ML IJ SOLN: 50.0000 ug | Freq: Once | INTRAMUSCULAR | Status: AC

## 2020-04-30 NOTE — ED Notes (Signed)
Patient provided sandwich per request. Per Dr. Dina Rich, patient's wife was updated on plan of care and is on her way to pick patient up.

## 2020-04-30 NOTE — ED Notes (Signed)
ED Provider at bedside. 

## 2020-04-30 NOTE — Discharge Instructions (Signed)
You were seen today for chest and back pain.  You had a significant work-up including CT scan and heart testing.  All of this is largely unremarkable.  Your fairly low risk for heart disease but need to follow-up with cardiology as an outpatient for stress testing and further evaluation.  Some of your symptoms may be related to gastritis which can be provoked by alcohol use.  Take medications as prescribed.

## 2020-04-30 NOTE — ED Provider Notes (Signed)
[facility] EMERGENCY DEPARTMENT Provider Note   CSN: [id number] Arrival date & time: 04/30/20  0024     History Chief Complaint  Patient presents with  . Hypotension    Omar Cross is a 54 y.o. male.  HPI     This is a 54 year old male with a history of alcohol and tobacco abuse, prior documented history of PE but not on anticoagulation who presents with chest pain, back pain, and shortness of breath.  Patient was watching TV with his wife when he had acute onset of sharp chest pain.  No known history of coronary artery disease.  In route he developed thoracic back pain.  He is now stating the back pain is most prominent and 10 out of 10.  EMS noted that he was hypotensive in the 5s.  Given 450 cc of fluid in route.  EKG was reportedly nonischemic.  He reports some shortness of breath.  No recent fevers or cough.  He is fully vaccinated against COVID-19.  Level 5 caveat for acuity of condition  Past Medical History:  Diagnosis Date  . Cancer (HCC)    Fibrosarcoma  . Pulmonary embolism ([place])   . Sciatic pain     Patient Active Problem List   Diagnosis Date Noted  . Erectile dysfunction 04/16/2019  . Class 1 obesity due to excess calories with body mass index (BMI) of 32.0 to 32.9 in adult 04/16/2019  . Alcohol use 04/16/2019  . Tobacco abuse 04/16/2019  . Depression 04/16/2019  . Right lumbar radiculopathy 04/16/2019  . 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 Back pain 09/06/2012    Past Surgical History:  Procedure Laterality Date  . ANKLE SURGERY    . excision of fibrosarcoma,low back    . lipoma surgeries         Family History  Problem Relation Age of Onset  . Hypertension Mother   . Healthy Father     Social History   Tobacco Use  . Smoking status: Current Every Day Smoker    Packs/day: 0.50    Years: 34.00    Pack years: 17.00    Types: Cigarettes  . Smokeless tobacco: Never Used  Vaping Use  . Vaping Use: Never  used  Substance Use Topics  . Alcohol use: Yes    Comment: Half a pint of liquor once monthly with stress.  Drinks three 40 oz  beers daily.  . Drug use: Not Currently    Types: "Crack" cocaine    Comment: Last used in 2017    Home Medications Prior to Admission medications   Medication Sig Start Date End Date Taking? Authorizing Provider  acetaminophen (TYLENOL) 500 MG tablet Take 2,000 mg by mouth every 6 (six) hours as needed for moderate pain.    [provider]  Cholecalciferol (VITAMIN D3) 50 MCG (2000 UT) TABS Take by mouth. 1 daily    [provider]  diphenhydrAMINE (BENADRYL) 25 MG tablet Take 25 mg by mouth every 6 (six) hours as needed.    [provider]  ibuprofen (ADVIL) 600 MG tablet TAKE 1 TABLET BY MOUTH EVERY 6 HOURS AS NEEDED FOR UP TO 10 DAYS 12/07/18   [provider]  Multiple Vitamin (MULTIVITAMIN WITH MINERALS) TABS Take 1 tablet by mouth daily.    [provider]  Omega-3 Fatty Acids (FISH OIL) 1000 MG CAPS Take 2 capsules by mouth daily.    [provider]  omeprazole (PRILOSEC) 20 MG capsule Take 1  capsule (20 mg total) by mouth daily. 04/30/20   Lexx Monte, Barbette Hair, MD  predniSONE (DELTASONE) 10 MG tablet 3 tabs by mouth daily for 5 day then decrease by 5 mg or 1/2 tab daily until off 01/09/19   Mack Hook, MD  simvastatin (ZOCOR) 40 MG tablet 1 by mouth daily with evening meal. 01/27/19   Mack Hook, MD  tiZANidine (ZANAFLEX) 4 MG tablet Take 4 mg by mouth every 8 (eight) hours as needed for muscle spasms.    [provider]    Allergies    Patient has no known allergies.  Review of Systems   Review of Systems  Constitutional: Negative for fever.  Respiratory: Positive for shortness of breath.   Cardiovascular: Positive for chest pain. Negative for leg swelling.  Gastrointestinal: Negative for abdominal pain, nausea and vomiting.  Musculoskeletal: Positive for back pain.   Neurological: Negative for weakness.  All other systems reviewed and are negative.   Physical Exam Updated Vital Signs BP 113/74   Pulse 80   Temp 97.8 F (36.6 C) (Temporal)   Resp 19   SpO2 95%   Physical Exam Vitals and nursing note reviewed.  Constitutional:      Appearance: He is well-developed. He is obese.     Comments: Appears uncomfortable  HENT:     Head: Normocephalic and atraumatic.     Mouth/Throat:     Mouth: Mucous membranes are moist.  Eyes:     Pupils: Pupils are equal, round, and reactive to light.  Cardiovascular:     Rate and Rhythm: Normal rate and regular rhythm.     Heart sounds: Normal heart sounds. No murmur heard.   Pulmonary:     Effort: Pulmonary effort is normal. No respiratory distress.     Breath sounds: Normal breath sounds. No wheezing.  Abdominal:     General: Bowel sounds are normal.     Palpations: Abdomen is soft.     Tenderness: There is no abdominal tenderness. There is no rebound.     Comments: Umbilical hernia  Musculoskeletal:     Cervical back: Neck supple.     Right lower leg: No edema.     Left lower leg: No edema.  Lymphadenopathy:     Cervical: No cervical adenopathy.  Skin:    General: Skin is warm and dry.     Comments: Extremities cool and clammy  Neurological:     Mental Status: He is alert and oriented to person, place, and time.     Comments: 5 out of 5 strength in all 4 extremities  Psychiatric:     Comments: Anxious appearing     ED Results / Procedures / Treatments   Labs (all labs ordered are listed, but only abnormal results are displayed) Labs Reviewed  CBC WITH DIFFERENTIAL/PLATELET - Abnormal; Notable for the following components:      Result Value   Lymphs Abs 4.5 (*)    Abs Immature Granulocytes 0.09 (*)    All other components within normal limits  BASIC METABOLIC PANEL - Abnormal; Notable for the following components:   CO2 20 (*)    Glucose, Bld 115 (*)    Creatinine, Ser 1.36 (*)     Calcium 8.6 (*)    All other components within normal limits  LACTIC ACID, PLASMA - Abnormal; Notable for the following components:   Lactic Acid, Venous 2.3 (*)    All other components within normal limits  ETHANOL - Abnormal; Notable for the following  components:   Alcohol, Ethyl (B) 69 (*)    All other components within normal limits  HEPATIC FUNCTION PANEL - Abnormal; Notable for the following components:   Total Protein 6.1 (*)    ALT 58 (*)    All other components within normal limits  I-STAT CHEM 8, ED - Abnormal; Notable for the following components:   Creatinine, Ser 1.40 (*)    Glucose, Bld 110 (*)    Calcium, Ion 1.07 (*)    TCO2 20 (*)    All other components within normal limits  RESP PANEL BY RT-PCR (FLU A&B, COVID) ARPGX2  LACTIC ACID, PLASMA  PROTIME-INR  LIPASE, BLOOD  TYPE AND SCREEN  ABO/RH  TROPONIN I (HIGH SENSITIVITY)  TROPONIN I (HIGH SENSITIVITY)    EKG EKG Interpretation  Date/Time:  Tuesday April 30 2020 00:33:55 EST Ventricular Rate:  85 PR Interval:    QRS Duration: 114 QT Interval:  395 QTC Calculation: 470 R Axis:   -20 Text Interpretation: Sinus rhythm Borderline intraventricular conduction delay Abnormal R-wave progression, early transition Confirmed by Thayer Jew [phone]) on 04/30/2020 12:54:46 AM   Radiology DG Chest Portable 1 View  Result Date: 04/30/2020 CLINICAL DATA:  Chest pain EXAM: PORTABLE CHEST 1 VIEW COMPARISON:  07/14/2017 FINDINGS: The heart size and mediastinal contours are within normal limits. Both lungs are clear. The visualized skeletal structures are unremarkable. IMPRESSION: No active disease. Electronically Signed   By: Fidela Salisbury MD   On: 04/30/2020 00:54   CT Angio Chest/Abd/Pel for Dissection W and/or Wo Contrast  Result Date: 04/30/2020 CLINICAL DATA:  Chest and back pain EXAM: CT ANGIOGRAPHY CHEST, ABDOMEN AND PELVIS TECHNIQUE: Non-contrast CT of the chest was initially obtained. Multidetector CT  imaging through the chest, abdomen and pelvis was performed using the standard protocol during bolus administration of intravenous contrast. Multiplanar reconstructed images and MIPs were obtained and reviewed to evaluate the vascular anatomy. CONTRAST:  161mL OMNIPAQUE IOHEXOL 350 MG/ML SOLN COMPARISON:  Chest x-ray from earlier in the same day, CT from 03/03/2017. FINDINGS: CTA CHEST FINDINGS Cardiovascular: Initial precontrast images demonstrate no aortic enlargement or hyperdense crescent to suggest aortic injury. The thoracic aorta shows no aneurysmal dilatation or dissection. No cardiac enlargement is noted. No coronary calcifications are seen. The pulmonary artery as visualized is within normal limits. No pulmonary emboli are noted. Mediastinum/Nodes: Thoracic inlet is within normal limits. A few small hilar and mediastinal lymph nodes are noted although not significant by size criteria. The esophagus as visualized is within normal limits. Lungs/Pleura: Lungs are well aerated bilaterally. Mild scarring is noted in the posterior aspect of the right lower lobe. No focal infiltrate or sizable effusion is seen. Mild emphysematous changes are noted. No sizable parenchymal nodules are seen. Musculoskeletal: No chest wall abnormality. No acute or significant osseous findings. Review of the MIP images confirms the above findings. CTA ABDOMEN AND PELVIS FINDINGS VASCULAR Aorta: Abdominal aorta demonstrates mild atherosclerotic changes without aneurysmal dilatation or dissection. Celiac: Patent without evidence of aneurysm, dissection, vasculitis or significant stenosis. SMA: Patent without evidence of aneurysm, dissection, vasculitis or significant stenosis. Renals: Both renal arteries are patent without evidence of aneurysm, dissection, vasculitis, fibromuscular dysplasia or significant stenosis. IMA: Patent without evidence of aneurysm, dissection, vasculitis or significant stenosis. Iliacs: Patent without evidence  of aneurysm, dissection, vasculitis or significant stenosis. Veins: No specific venous abnormality is noted. Review of the MIP images confirms the above findings. NON-VASCULAR Hepatobiliary: Fatty infiltration of the liver is noted. The gallbladder is within  normal limits. Pancreas: Unremarkable. No pancreatic ductal dilatation or surrounding inflammatory changes. Spleen: Normal in size without focal abnormality. Adrenals/Urinary Tract: Adrenal glands are within normal limits. Kidneys demonstrate a normal enhancement pattern with the exception of a few small cysts within the right kidney. No renal calculi or obstructive changes are noted. The bladder is decompressed. Stomach/Bowel: Scattered diverticular change of the colon is noted without evidence of diverticulitis. The appendix is within normal limits. No small bowel or gastric abnormality is noted. Lymphatic: No sizable lymphadenopathy is seen. Reproductive: Prostate is unremarkable. Other: No abdominal wall hernia or abnormality. No abdominopelvic ascites. Musculoskeletal: No acute or significant osseous findings. Review of the MIP images confirms the above findings. IMPRESSION: CTA of the chest: No aortic dissection or aneurysmal dilatation. No evidence of pulmonary emboli. Chronic scarring in the right lower lobe. CTA of the abdomen and pelvis: No evidence of aortic abnormality is noted. Mild diverticular change without diverticulitis. Fatty liver. Right renal cysts. Electronically Signed   By: Inez Catalina M.D.   On: 04/30/2020 01:20    Procedures .Critical Care Performed by: Merryl Hacker, MD Authorized by: Merryl Hacker, MD   Critical care provider statement:    Critical care time (minutes):  31   Critical care was time spent personally by me on the following activities:  Discussions with consultants, evaluation of patient's response to treatment, examination of patient, ordering and performing treatments and interventions, ordering and  review of laboratory studies, ordering and review of radiographic studies, pulse oximetry, re-evaluation of patient's condition, obtaining history from patient or surrogate and review of old charts   (including critical care time)  Medications Ordered in ED Medications  sodium chloride 0.9 % bolus 1,000 mL (0 mLs Intravenous Stopped 04/30/20 0200)  fentaNYL (SUBLIMAZE) injection 50 mcg (50 mcg Intravenous Given 04/30/20 0043)  iohexol (OMNIPAQUE) 350 MG/ML injection 100 mL (100 mLs Intravenous Contrast Given 04/30/20 0108)  alum & mag hydroxide-simeth (MAALOX/MYLANTA) 200-200-20 MG/5ML suspension 30 mL (30 mLs Oral Given 04/30/20 0354)  pantoprazole (PROTONIX) EC tablet 20 mg (20 mg Oral Given 04/30/20 0354)    ED Course  I have reviewed the triage vital signs and the nursing notes.  Pertinent labs & imaging results that were available during my care of the patient were reviewed by me and considered in my medical decision making (see chart for details).  Clinical Course as of May 01 523  Tue Apr 30, 2020  0335 On recheck, patient is now asymptomatic.  Has been asymptomatic since receiving 50 mcg of fentanyl upon arrival.  He is resting comfortably.  Full work-up reviewed.  He had a full CT of the chest, abdomen and pelvis which showed no evidence of dissection, PE.  Troponin x2 -.  EKG without acute ischemic changes.  Question GI etiology given ongoing alcohol abuse.  Could be gastritis versus peptic ulcer.  Will allow to p.o. challenge   [CH]    Clinical Course User Index [CH] Kostantinos Tallman, Barbette Hair, MD   MDM Rules/Calculators/A&P                          Patient presents with chest pain.  Now complaining of significant back pain in the upper and thoracic back.  He is ill-appearing but nontoxic.  Vital signs notable for blood pressure of 95/70.  Considerations include, dissection, ACS although he is fairly low risk, recurrent PE, GI etiology such as gastritis or peptic ulcer, pancreatitis.   Given  his vital signs and significance of pain, will obtain a CT angio of the chest and abdomen to rule out dissection.  This will also evaluate adequately for a PE.  Labs obtained.  Labs notable for normal lactate.  No leukocytosis.  Creatinine slightly above baseline at 1.4.  Initial troponin is within normal range.  EKG without acute ischemic or arrhythmic changes.  Chest x-ray without widening of the mediastinum, pneumothorax, or pneumonia.  CTA negative for acute dissection, aneurysm, or PE.  No other obvious intrathoracic or abdominal issues.  Lipase and abdominal labs are reassuring.  Question gastritis versus ulcer.  Patient is more comfortable on reassessment.  Blood pressure is stabilized with fluids and he is now asymptomatic.  He was given Protonix and a GI cocktail.  He is fairly low risk from an ACS perspective and repeat troponin is flat.  History is also inconsistent.  Feel he can follow-up as an outpatient with cardiology.  Recommend abstinence from alcohol and omeprazole to cover for gastritis and ulcer.  Patient is agreeable to plan.  After history, exam, and medical workup I feel the patient has been appropriately medically screened and is safe for discharge home. Pertinent diagnoses were discussed with the patient. Patient was given return precautions.  Final Clinical Impression(s) / ED Diagnoses Final diagnoses:  Atypical chest pain  Acute alcoholic gastritis without hemorrhage    Rx / DC Orders ED Discharge Orders         Ordered    omeprazole (PRILOSEC) 20 MG capsule  Daily        04/30/20 0522           Merryl Hacker, MD 04/30/20 0530

## 2020-04-30 NOTE — ED Triage Notes (Signed)
Pt BIB [facility] from home, c/o sudden onset shortness of breath, chest pain, and back pain. On EMS arrival, pt hypotensive at 76/37, given 450ccNS pta.

## 2020-04-30 NOTE — ED Notes (Signed)
Patient verbalized understanding of dc instructions, vss, ambulatory with nad.   

## 2020-04-30 NOTE — ED Notes (Signed)
Patient provided ginger ale for PO fluid challenge.

## 2021-02-14 ENCOUNTER — Ambulatory Visit: Payer: Self-pay | Admitting: Internal Medicine

## 2021-03-03 ENCOUNTER — Ambulatory Visit: Payer: Self-pay | Admitting: Internal Medicine

## 2021-03-03 ENCOUNTER — Encounter: Payer: Self-pay | Admitting: Internal Medicine

## 2021-03-03 ENCOUNTER — Other Ambulatory Visit: Payer: Self-pay

## 2021-03-03 VITALS — BP 122/88 | HR 80 | Resp 20 | Ht 72.0 in | Wt 267.0 lb

## 2021-03-03 DIAGNOSIS — Z789 Other specified health status: Secondary | ICD-10-CM

## 2021-03-03 DIAGNOSIS — G8929 Other chronic pain: Secondary | ICD-10-CM

## 2021-03-03 DIAGNOSIS — M545 Low back pain, unspecified: Secondary | ICD-10-CM

## 2021-03-03 DIAGNOSIS — M25562 Pain in left knee: Secondary | ICD-10-CM

## 2021-03-03 DIAGNOSIS — Z720 Tobacco use: Secondary | ICD-10-CM

## 2021-03-03 DIAGNOSIS — E6609 Other obesity due to excess calories: Secondary | ICD-10-CM

## 2021-03-03 DIAGNOSIS — M722 Plantar fascial fibromatosis: Secondary | ICD-10-CM

## 2021-03-03 MED ORDER — GABAPENTIN 800 MG PO TABS
ORAL_TABLET | ORAL | Status: DC
Start: 2021-03-03 — End: 2023-05-17

## 2021-03-03 MED ORDER — NAPROXEN 500 MG PO TABS: 500.0000 mg | ORAL_TABLET | Freq: Two times a day (BID) | ORAL | 6 refills | Status: DC

## 2021-03-03 NOTE — Progress Notes (Unsigned)
    Subjective:    Patient ID: Omar Cross, male   DOB: 06/01/1966, 55 y.o.   MRN: [id number]   HPI  Here to reestablish.   Has not been seen in 2 years. Applying for disability.   Has been taking friend's meds.     Left knee pain:  For over 1 year.  No history of injury.  Has a knot in the superior medial aspect that when he presses there, hurts quite a bit.  Standing or sitting for a period of time and then moving hurts.  Can awaken him at night.    2.  Right heel pain:  Bottom of right heel.  Generally, wears cowboy boots or steel toed boots.  Pain in any shoe.  Has had for 1-2 years.  When was working, was on cement floors all the time.  Stopped working about 1 year ago.  No barefooting.  Pain worse when first gets up.     3.  Chronic low back pain:  Standing and bending over, hurts.  Has had for years.  No radicular symptoms down legs, though pain into buttocks.  Has not had PT--only to a chiropractor.    4.  Would like HM items checked.    5.  Concerned about liver as he was taking Tylenol 3000 mg and drinking alcohol for pain.  6.  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  Has had multiple subcutaneous lesions removed throughout the years, but no recurrence.  Surgery at [facility].    7.  Alcohol:  History of drinking 4-5 40s every night.  Stopped about 2 months ago.      Current Meds  Medication Sig   acetaminophen (TYLENOL) 500 MG tablet Take 2,000 mg by mouth every 6 (six) hours as needed for moderate pain.   diphenhydrAMINE (BENADRYL) 25 MG tablet Take 25 mg by mouth every 6 (six) hours as needed.   gabapentin (NEURONTIN) 800 MG tablet Take 800 mg by mouth 2 (two) times daily.   Multiple Vitamin (MULTIVITAMIN WITH MINERALS) TABS Take 1 tablet by mouth daily.   No Known Allergies   Review of Systems    Objective:   BP 122/88 (BP Location: Right Arm, Patient Position: Sitting, Cuff Size: Normal)   Pulse 80   Resp 20   Ht 6' (1.829 m)   Wt 267  lb (121.1 kg)   BMI 36.21 kg/m   Physical Exam   Assessment & Plan  No vaccines--did not tolerate well the first two, [place] card for derm and MR of back ***

## 2021-03-03 NOTE — Patient Instructions (Signed)
Swim!!! Especially with whip kick  [place].  [facility] [place], [place] [address]  [phone] Hours of Operation Mondays to Thursdays: 8 am to 8 pm Fridays: 9 am to 8 pm Saturdays: 9 am to 1 pm Sundays: Closed   Do right foot stretches before getting out of bed. No barefootedness even in morning. Get some really good cushioned slippers. Good heel cups for shoes. Bottle of ice to roll under barefoot when at rest  Stretch foot out on curb.  [facility] First floor [address], [place], [place]  For Xray--ask how much if pay up front.

## 2021-03-10 ENCOUNTER — Other Ambulatory Visit: Payer: Self-pay

## 2021-03-11 ENCOUNTER — Other Ambulatory Visit: Payer: Self-pay

## 2021-03-11 ENCOUNTER — Telehealth: Payer: Self-pay

## 2021-03-11 DIAGNOSIS — Z125 Encounter for screening for malignant neoplasm of prostate: Secondary | ICD-10-CM

## 2021-03-11 DIAGNOSIS — Z13220 Encounter for screening for lipoid disorders: Secondary | ICD-10-CM

## 2021-03-11 DIAGNOSIS — Z79899 Other long term (current) drug therapy: Secondary | ICD-10-CM

## 2021-03-11 NOTE — Telephone Encounter (Signed)
Noticed last week a mass on testicle area. About the size of a quarter. No pain. Has not noticed changes in size.

## 2021-03-12 LAB — CBC WITH DIFFERENTIAL/PLATELET
Basophils Absolute: 0 10*3/uL (ref 0.0–0.2)
Basos: 1 %
EOS (ABSOLUTE): 0.3 10*3/uL (ref 0.0–0.4)
Eos: 4 %
Hematocrit: 44 % (ref 37.5–51.0)
Hemoglobin: 14.8 g/dL (ref 13.0–17.7)
Immature Grans (Abs): 0 10*3/uL (ref 0.0–0.1)
Immature Granulocytes: 1 %
Lymphocytes Absolute: 2.7 10*3/uL (ref 0.7–3.1)
Lymphs: 42 %
MCH: 32.4 pg (ref 26.6–33.0)
MCHC: 33.6 g/dL (ref 31.5–35.7)
MCV: 96 fL (ref 79–97)
Monocytes Absolute: 0.6 10*3/uL (ref 0.1–0.9)
Monocytes: 10 %
Neutrophils Absolute: 2.8 10*3/uL (ref 1.4–7.0)
Neutrophils: 42 %
Platelets: 308 10*3/uL (ref 150–450)
RBC: 4.57 x10E6/uL (ref 4.14–5.80)
RDW: 13.3 % (ref 11.6–15.4)
WBC: 6.5 10*3/uL (ref 3.4–10.8)

## 2021-03-12 LAB — LIPID PANEL W/O CHOL/HDL RATIO
Cholesterol, Total: 260 mg/dL — ABNORMAL HIGH (ref 100–199)
HDL: 50 mg/dL (ref >39)
LDL Chol Calc (NIH): 161 mg/dL — ABNORMAL HIGH (ref 0–99)
Triglycerides: 263 mg/dL — ABNORMAL HIGH (ref 0–149)
VLDL Cholesterol Cal: 49 mg/dL — ABNORMAL HIGH (ref 5–40)

## 2021-03-12 LAB — COMPREHENSIVE METABOLIC PANEL
ALT: 54 IU/L — ABNORMAL HIGH (ref 0–44)
AST: 50 IU/L — ABNORMAL HIGH (ref 0–40)
Albumin/Globulin Ratio: 1.6 (ref 1.2–2.2)
Albumin: 4.1 g/dL (ref 3.8–4.9)
Alkaline Phosphatase: 91 IU/L (ref 44–121)
BUN/Creatinine Ratio: 15 (ref 9–20)
BUN: 15 mg/dL (ref 6–24)
Bilirubin Total: 0.3 mg/dL (ref 0.0–1.2)
CO2: 24 mmol/L (ref 20–29)
Calcium: 9 mg/dL (ref 8.7–10.2)
Chloride: 104 mmol/L (ref 96–106)
Creatinine, Ser: 0.98 mg/dL (ref 0.76–1.27)
Globulin, Total: 2.6 g/dL (ref 1.5–4.5)
Glucose: 96 mg/dL (ref 70–99)
Potassium: 5 mmol/L (ref 3.5–5.2)
Sodium: 141 mmol/L (ref 134–144)
Total Protein: 6.7 g/dL (ref 6.0–8.5)
eGFR: 92 mL/min/{1.73_m2} (ref >59)

## 2021-03-12 LAB — PSA: Prostate Specific Ag, Serum: 0.4 ng/mL (ref 0.0–4.0)

## 2021-03-22 IMAGING — DX DG CHEST 1V PORT
1 series · 1 of 1 positions shown · non-contrast
Comparison: 07/14/2017

CLINICAL DATA: Chest pain

EXAM:
PORTABLE CHEST 1 VIEW

[chest ap]
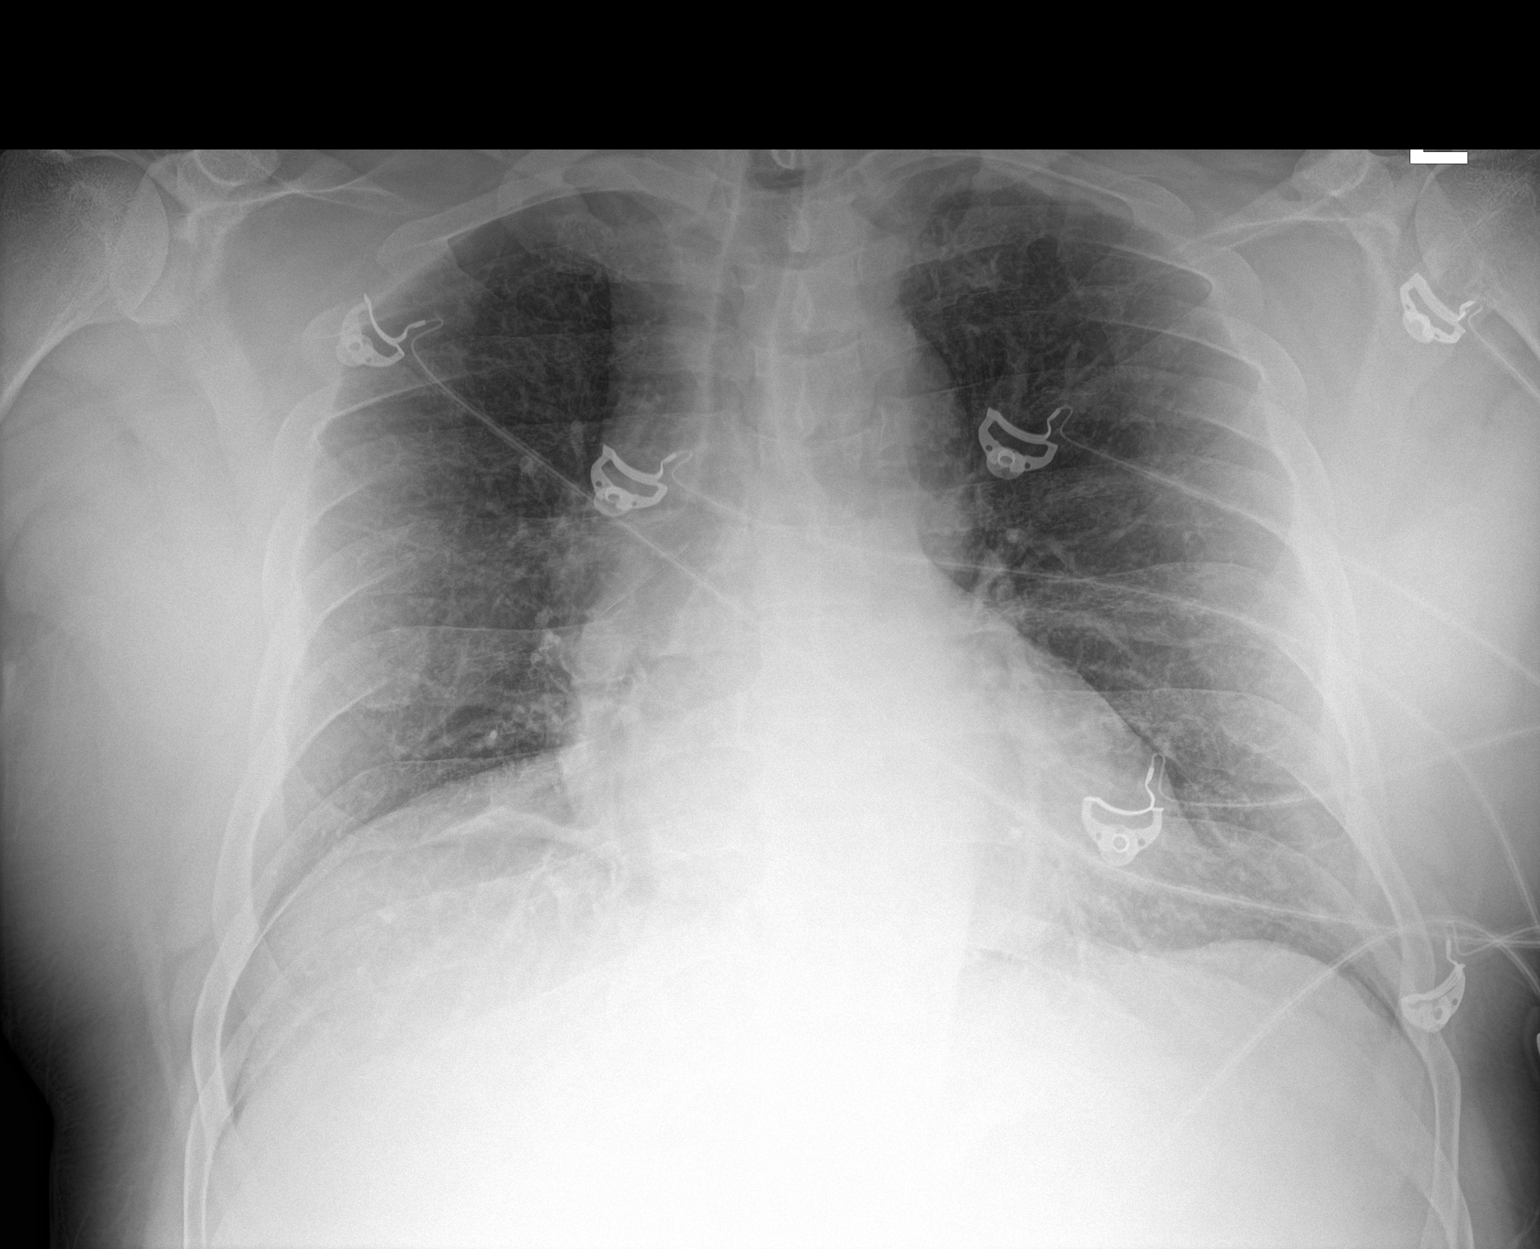

[1 of 1 positions shown; findings below may reference images not displayed]

FINDINGS: The heart size and mediastinal contours are within normal limits.
Both lungs are clear. The visualized skeletal structures are
unremarkable.
IMPRESSION: No active disease.

## 2021-04-07 NOTE — Telephone Encounter (Signed)
Pt scheduled  

## 2021-05-05 ENCOUNTER — Ambulatory Visit: Payer: Self-pay | Admitting: Internal Medicine

## 2021-05-23 ENCOUNTER — Other Ambulatory Visit: Payer: Self-pay | Admitting: Internal Medicine

## 2021-05-23 MED ORDER — SIMVASTATIN 40 MG PO TABS
ORAL_TABLET | ORAL | 11 refills | Status: DC
Start: 2021-05-23 — End: 2023-05-17

## 2021-06-19 ENCOUNTER — Encounter: Payer: Self-pay | Admitting: Internal Medicine

## 2021-08-26 ENCOUNTER — Encounter (HOSPITAL_COMMUNITY): Payer: Self-pay

## 2021-08-26 ENCOUNTER — Ambulatory Visit (HOSPITAL_COMMUNITY)
Admission: EM | Admit: 2021-08-26 | Discharge: 2021-08-26 | Disposition: A | Discharge status: Home / Self Care | Payer: Self-pay | Attending: Family Medicine | Admitting: Family Medicine

## 2021-08-26 DIAGNOSIS — L03211 Cellulitis of face: Secondary | ICD-10-CM

## 2021-08-26 MED ORDER — IBUPROFEN 800 MG PO TABS: 800.0000 mg | ORAL_TABLET | Freq: Three times a day (TID) | ORAL | 0 refills | Status: DC | PRN

## 2021-08-26 MED ORDER — AMOXICILLIN-POT CLAVULANATE 875-125 MG PO TABS: 1.0000 | ORAL_TABLET | Freq: Two times a day (BID) | ORAL | 0 refills | Status: AC

## 2021-08-26 MED ORDER — KETOROLAC TROMETHAMINE 30 MG/ML IJ SOLN
INTRAMUSCULAR | Status: AC
  Filled 2021-08-26: qty 1

## 2021-08-26 MED ORDER — KETOROLAC TROMETHAMINE 30 MG/ML IJ SOLN
30.0000 mg | Freq: Once | INTRAMUSCULAR | Status: AC
  Administered 2021-08-26: 30 mg via INTRAMUSCULAR

## 2021-08-26 NOTE — Discharge Instructions (Addendum)
You have been given a shot of Toradol 30 mg today ? ?Take amoxicillin-clavulanate 875 mg--1 tab twice daily with food for 7 days ? ?Take ibuprofen 800 mg --1 every 8 hours as needed for pain ?

## 2021-08-26 NOTE — ED Provider Notes (Signed)
?[place] ? ? ? ?CSN: [id number] ?Arrival date & time: 08/26/21  1542 ? ? ?  ? ?History   ?Chief Complaint ?Chief Complaint  ?Patient presents with  ? Dental Pain  ? ? ?HPI ?Omar Cross is a 56 y.o. male.  ? ? ?Dental Pain ?Here for left facial swelling and tooth pain.  Is been bothering him for about 3 months, but now it is worse in the last day or 2.  No fever or chills. ? ?Past Medical History:  ?Diagnosis Date  ? Cancer [facility])   ? Fibrosarcoma  ? Pulmonary embolism ([place])   ? Sciatic pain   ? ? ?Patient Active Problem List  ? Diagnosis Date Noted  ? Erectile dysfunction 04/16/2019  ? Class 1 obesity due to excess calories with body mass index (BMI) of 32.0 to 32.9 in adult 04/16/2019  ? Alcohol use 04/16/2019  ? Tobacco abuse 04/16/2019  ? Depression 04/16/2019  ? Right lumbar radiculopathy 04/16/2019  ? Fibrosarcoma, s/p surgery 09/06/2012  ? Back pain 09/06/2012  ? ? ?Past Surgical History:  ?Procedure Laterality Date  ? ANKLE SURGERY    ? excision of fibrosarcoma,low back  03/01/2001  ? [place].  No adjuvant therapy.  ? lipoma surgeries    ? ? ? ? ? ?Home Medications   ? ?Prior to Admission medications   ?Medication Sig Start Date End Date Taking? Authorizing Provider  ?amoxicillin-clavulanate (AUGMENTIN) 875-125 MG tablet Take 1 tablet by mouth 2 (two) times daily for 7 days. 08/26/21 09/02/21 Yes Barrett Henle, MD  ?ibuprofen (ADVIL) 800 MG tablet Take 1 tablet (800 mg total) by mouth every 8 (eight) hours as needed (pain). 08/26/21  Yes Barrett Henle, MD  ?acetaminophen (TYLENOL) 500 MG tablet Take 2,000 mg by mouth every 6 (six) hours as needed for moderate pain.    [provider]  ?Cholecalciferol (VITAMIN D3) 50 MCG (2000 UT) TABS Take by mouth. 1 daily ?Patient not taking: Reported on 03/03/2021    [provider]  ?diphenhydrAMINE (BENADRYL) 25 MG tablet Take 25 mg by mouth every 6 (six) hours as needed.    [provider]  ?gabapentin (NEURONTIN) 800  MG tablet 1 tab by mouth in morning, 1/2 tab midday and 1 tab at bedtime. 03/03/21   Mack Hook, MD  ?Multiple Vitamin (MULTIVITAMIN WITH MINERALS) TABS Take 1 tablet by mouth daily.    [provider]  ?naproxen (NAPROSYN) 500 MG tablet Take 1 tablet (500 mg total) by mouth 2 (two) times daily with a meal. 03/03/21   Mack Hook, MD  ?Omega-3 Fatty Acids (FISH OIL) 1000 MG CAPS Take 2 capsules by mouth daily. ?Patient not taking: Reported on 03/03/2021    [provider]  ?omeprazole (PRILOSEC) 20 MG capsule Take 1 capsule (20 mg total) by mouth daily. ?Patient not taking: Reported on 03/03/2021 04/30/20   Merryl Hacker, MD  ?simvastatin (ZOCOR) 40 MG tablet 1 by mouth daily with evening meal. 05/23/21   Mack Hook, MD  ?tiZANidine (ZANAFLEX) 4 MG tablet Take 4 mg by mouth every 8 (eight) hours as needed for muscle spasms. ?Patient not taking: Reported on 03/03/2021    [provider]  ? ? ?Family History ?Family History  ?Problem Relation Age of Onset  ? Hypertension Mother   ? Healthy Father   ? ? ?Social History ?Social History  ? ?Tobacco Use  ? Smoking status: Every Day  ?  Packs/day: 0.50  ?  Years: 34.00  ?  Pack years: 17.00  ?  Types: Cigarettes  ? Smokeless tobacco: Never  ?Vaping Use  ? Vaping Use: Never used  ?Substance Use Topics  ? Alcohol use: Yes  ?  Comment: 03/2021:  now half glass of wine watered down.  ? Drug use: Not Currently  ?  Types: "Crack" cocaine  ?  Comment: Last used in 2017  ? ? ? ?Allergies   ?Patient has no known allergies. ? ? ?Review of Systems ?Review of Systems ? ? ?Physical Exam ?Triage Vital Signs ?ED Triage Vitals  ?Enc Vitals Group  ?   BP 08/26/21 1731 119/87  ?   Pulse Rate 08/26/21 1731 89  ?   Resp 08/26/21 1731 18  ?   Temp 08/26/21 1731 98.5 ?F (36.9 ?C)  ?   Temp Source 08/26/21 1731 Oral  ?   SpO2 08/26/21 1731 95 %  ?   Weight --   ?   Height --   ?   Head Circumference --   ?   Peak Flow --   ?   Pain Score  08/26/21 1726 10  ?   Pain Loc --   ?   Pain Edu? --   ?   Excl. in [place]? --   ? ?No data found. ? ?Updated Vital Signs ?BP 119/87 (BP Location: Left Arm)   Pulse 89   Temp 98.5 ?F (36.9 ?C) (Oral)   Resp 18   SpO2 95%  ? ?Visual Acuity ?Right Eye Distance:   ?Left Eye Distance:   ?Bilateral Distance:   ? ?Right Eye Near:   ?Left Eye Near:    ?Bilateral Near:    ? ?Physical Exam ?Vitals reviewed.  ?Constitutional:   ?   General: He is not in acute distress. ?   Appearance: He is not ill-appearing, toxic-appearing or diaphoretic.  ?HENT:  ?   Head:  ?   Comments: There is swelling of his cheek.  And tenderness.  No fluctuance. ?   Mouth/Throat:  ?   Mouth: Mucous membranes are moist.  ?   Pharynx: No oropharyngeal exudate or posterior oropharyngeal erythema.  ?Eyes:  ?   Extraocular Movements: Extraocular movements intact.  ?   Pupils: Pupils are equal, round, and reactive to light.  ?Cardiovascular:  ?   Rate and Rhythm: Normal rate and regular rhythm.  ?   Heart sounds: No murmur heard. ?Pulmonary:  ?   Effort: Pulmonary effort is normal.  ?   Breath sounds: Normal breath sounds.  ?Skin: ?   Coloration: Skin is not jaundiced or pale.  ?Neurological:  ?   General: No focal deficit present.  ?   Mental Status: He is alert and oriented to person, place, and time.  ?Psychiatric:     ?   Behavior: Behavior normal.  ? ? ? ?UC Treatments / Results  ?Labs ?(all labs ordered are listed, but only abnormal results are displayed) ?Labs Reviewed - No data to display ? ?EKG ? ? ?Radiology ?No results found. ? ?Procedures ?Procedures (including critical care time) ? ?Medications Ordered in UC ?Medications  ?ketorolac (TORADOL) 30 MG/ML injection 30 mg (has no administration in time range)  ? ? ?Initial Impression / Assessment and Plan / UC Course  ?I have reviewed the triage vital signs and the nursing notes. ? ?Pertinent labs & imaging results that were available during my care of the patient were reviewed by me and considered  in my medical decision making (see chart for  details). ? ?  ? ?We will treat for the pain and infection.  He is given a list of low-cost dental care options ?Final Clinical Impressions(s) / UC Diagnoses  ? ?Final diagnoses:  ?Cellulitis of face  ? ? ? ?Discharge Instructions   ? ?  ?You have been given a shot of Toradol 30 mg today ? ?Take amoxicillin-clavulanate 875 mg--1 tab twice daily with food for 7 days ? ?Take ibuprofen 800 mg --1 every 8 hours as needed for pain ? ? ? ? ?ED Prescriptions   ? ? Medication Sig Dispense Auth. Provider  ? amoxicillin-clavulanate (AUGMENTIN) 875-125 MG tablet Take 1 tablet by mouth 2 (two) times daily for 7 days. 14 tablet Olayinka Gathers, Gwenlyn Perking, MD  ? ibuprofen (ADVIL) 800 MG tablet Take 1 tablet (800 mg total) by mouth every 8 (eight) hours as needed (pain). 21 tablet Barrett Henle, MD  ? ?  ? ?I have reviewed the PDMP during this encounter. ?  ?Barrett Henle, MD ?08/26/21 1818 ? ?

## 2021-08-26 NOTE — ED Triage Notes (Signed)
Pt states dental pain on left side. Pt states broken tooth on upper left side of the mouth. Pt has no remembering on how tooth broke.  Pt states swelling on left side of the face..Pt used cinnamon to heal tooth pain. ?

## 2021-09-02 ENCOUNTER — Encounter: Payer: Self-pay | Admitting: Internal Medicine

## 2023-05-17 ENCOUNTER — Encounter (HOSPITAL_COMMUNITY): Payer: Self-pay

## 2023-05-17 ENCOUNTER — Emergency Department (HOSPITAL_COMMUNITY): Payer: Self-pay

## 2023-05-17 ENCOUNTER — Other Ambulatory Visit: Payer: Self-pay

## 2023-05-17 ENCOUNTER — Observation Stay (HOSPITAL_COMMUNITY)
Admission: EM | Admit: 2023-05-17 | Discharge: 2023-05-18 | Disposition: A | Discharge status: Home / Self Care | Payer: Self-pay | Attending: Family Medicine | Admitting: Family Medicine

## 2023-05-17 DIAGNOSIS — Z85830 Personal history of malignant neoplasm of bone: Secondary | ICD-10-CM | POA: Insufficient documentation

## 2023-05-17 DIAGNOSIS — K429 Umbilical hernia without obstruction or gangrene: Secondary | ICD-10-CM

## 2023-05-17 DIAGNOSIS — K922 Gastrointestinal hemorrhage, unspecified: Principal | ICD-10-CM

## 2023-05-17 DIAGNOSIS — F17210 Nicotine dependence, cigarettes, uncomplicated: Secondary | ICD-10-CM | POA: Insufficient documentation

## 2023-05-17 DIAGNOSIS — C499 Malignant neoplasm of connective and soft tissue, unspecified: Secondary | ICD-10-CM

## 2023-05-17 DIAGNOSIS — M8700 Idiopathic aseptic necrosis of unspecified bone: Secondary | ICD-10-CM

## 2023-05-17 DIAGNOSIS — K921 Melena: Principal | ICD-10-CM

## 2023-05-17 DIAGNOSIS — Z720 Tobacco use: Secondary | ICD-10-CM

## 2023-05-17 DIAGNOSIS — Z9889 Other specified postprocedural states: Secondary | ICD-10-CM | POA: Insufficient documentation

## 2023-05-17 DIAGNOSIS — M5416 Radiculopathy, lumbar region: Secondary | ICD-10-CM

## 2023-05-17 DIAGNOSIS — F1010 Alcohol abuse, uncomplicated: Secondary | ICD-10-CM

## 2023-05-17 DIAGNOSIS — Z86711 Personal history of pulmonary embolism: Secondary | ICD-10-CM | POA: Insufficient documentation

## 2023-05-17 LAB — COMPREHENSIVE METABOLIC PANEL
ALT: 51 U/L — ABNORMAL HIGH (ref 0–44)
AST: 32 U/L (ref 15–41)
Albumin: 3.6 g/dL (ref 3.5–5.0)
Alkaline Phosphatase: 85 U/L (ref 38–126)
Anion gap: 10 (ref 5–15)
BUN: 11 mg/dL (ref 6–20)
CO2: 21 mmol/L — ABNORMAL LOW (ref 22–32)
Calcium: 9.1 mg/dL (ref 8.9–10.3)
Chloride: 107 mmol/L (ref 98–111)
Creatinine, Ser: 0.93 mg/dL (ref 0.61–1.24)
GFR, Estimated: >60 mL/min (ref >60)
Glucose, Bld: 93 mg/dL (ref 70–99)
Potassium: 4.3 mmol/L (ref 3.5–5.1)
Sodium: 138 mmol/L (ref 135–145)
Total Bilirubin: 0.7 mg/dL (ref <1.2)
Total Protein: 7.3 g/dL (ref 6.5–8.1)

## 2023-05-17 LAB — CBC WITH DIFFERENTIAL/PLATELET
Abs Immature Granulocytes: 0.02 10*3/uL (ref 0.00–0.07)
Basophils Absolute: 0 10*3/uL (ref 0.0–0.1)
Basophils Relative: 1 %
Eosinophils Absolute: 0.2 10*3/uL (ref 0.0–0.5)
Eosinophils Relative: 3 %
HCT: 46.4 % (ref 39.0–52.0)
Hemoglobin: 15.8 g/dL (ref 13.0–17.0)
Immature Granulocytes: 0 %
Lymphocytes Relative: 43 %
Lymphs Abs: 3 10*3/uL (ref 0.7–4.0)
MCH: 31.7 pg (ref 26.0–34.0)
MCHC: 34.1 g/dL (ref 30.0–36.0)
MCV: 93 fL (ref 80.0–100.0)
Monocytes Absolute: 0.6 10*3/uL (ref 0.1–1.0)
Monocytes Relative: 9 %
Neutro Abs: 3.1 10*3/uL (ref 1.7–7.7)
Neutrophils Relative %: 44 %
Platelets: 289 10*3/uL (ref 150–400)
RBC: 4.99 MIL/uL (ref 4.22–5.81)
RDW: 13.6 % (ref 11.5–15.5)
WBC: 6.9 10*3/uL (ref 4.0–10.5)
nRBC: 0 % (ref 0.0–0.2)

## 2023-05-17 LAB — TYPE AND SCREEN
ABO/RH(D): A POS
Antibody Screen: NEGATIVE

## 2023-05-17 LAB — MAGNESIUM: Magnesium: 2.1 mg/dL (ref 1.7–2.4)

## 2023-05-17 LAB — CBC
HCT: 42.2 % (ref 39.0–52.0)
Hemoglobin: 14.2 g/dL (ref 13.0–17.0)
MCH: 31.2 pg (ref 26.0–34.0)
MCHC: 33.6 g/dL (ref 30.0–36.0)
MCV: 92.7 fL (ref 80.0–100.0)
Platelets: 272 10*3/uL (ref 150–400)
RBC: 4.55 MIL/uL (ref 4.22–5.81)
RDW: 13.6 % (ref 11.5–15.5)
WBC: 7.9 10*3/uL (ref 4.0–10.5)
nRBC: 0 % (ref 0.0–0.2)

## 2023-05-17 LAB — PHOSPHORUS: Phosphorus: 2.9 mg/dL (ref 2.5–4.6)

## 2023-05-17 LAB — POC OCCULT BLOOD, ED: Fecal Occult Bld: POSITIVE — AB

## 2023-05-17 LAB — PROTIME-INR
INR: 1 (ref 0.8–1.2)
Prothrombin Time: 13.1 s (ref 11.4–15.2)

## 2023-05-17 LAB — I-STAT CG4 LACTIC ACID, ED: Lactic Acid, Venous: 1.6 mmol/L (ref 0.5–1.9)

## 2023-05-17 MED ORDER — ACETAMINOPHEN 650 MG RE SUPP
650.0000 mg | Freq: Four times a day (QID) | RECTAL | Status: DC | PRN
Start: 2023-05-17 — End: 2023-05-18

## 2023-05-17 MED ORDER — PANTOPRAZOLE SODIUM 40 MG IV SOLR
40.0000 mg | Freq: Every day | INTRAVENOUS | Status: DC
  Administered 2023-05-17: 40 mg via INTRAVENOUS
  Filled 2023-05-17: qty 10

## 2023-05-17 MED ORDER — IOHEXOL 350 MG/ML SOLN
75.0000 mL | Freq: Once | INTRAVENOUS | Status: AC | PRN
  Administered 2023-05-17: 75 mL via INTRAVENOUS

## 2023-05-17 MED ORDER — ACETAMINOPHEN 325 MG PO TABS
650.0000 mg | ORAL_TABLET | Freq: Four times a day (QID) | ORAL | Status: DC | PRN
  Administered 2023-05-17: 650 mg via ORAL
  Filled 2023-05-17: qty 2

## 2023-05-17 NOTE — ED Notes (Signed)
ED TO INPATIENT HANDOFF REPORT  ED Nurse Name and Phone #: Osvaldo Shipper RN [phone]  S Name/Age/Gender Omar Cross 57 y.o. male Room/Bed: 026C/026C  Code Status   Code Status: Not on file  Home/SNF/Other Home Patient oriented to: self, place, time, and situation Is this baseline? Yes   Triage Complete: Triage complete  Chief Complaint Rectal Bleeding  Triage Note Pt reports BRB in stools for 2 days with some lower abd pain, hx of diverticulitis years ago.    Allergies No Known Allergies  Level of Care/Admitting Diagnosis ED Disposition     ED Disposition  Admit   Condition  --   Comment  The patient appears reasonably stabilized for admission considering the current resources, flow, and capabilities available in the ED at this time, and I doubt any other [facility] requiring further screening and/or treatment in the ED prior to admission is  present.          B Medical/Surgery History Past Medical History:  Diagnosis Date   Cancer (HCC)    Fibrosarcoma   Pulmonary embolism (HCC)    Sciatic pain    Past Surgical History:  Procedure Laterality Date   ANKLE SURGERY     excision of fibrosarcoma,low back  03/01/2001   [facility].  No adjuvant therapy.   lipoma surgeries       A IV Location/Drains/Wounds Patient Lines/Drains/Airways Status     Active Line/Drains/Airways     Name Placement date Placement time Site Days   Peripheral IV 05/17/23 20 G Left Antecubital 05/17/23  1221  Antecubital  less than 1            Intake/Output Last 24 hours No intake or output data in the 24 hours ending 05/17/23 1805  Labs/Imaging Results for orders placed or performed during the hospital encounter of 05/17/23 (from the past 48 hours)  Comprehensive metabolic panel     Status: Abnormal   Collection Time: 05/17/23 12:16 PM  Result Value Ref Range   Sodium 138 135 - 145 mmol/L   Potassium 4.3 3.5 - 5.1 mmol/L   Chloride 107 98 - 111 mmol/L   CO2 21 (L) 22  - 32 mmol/L   Glucose, Bld 93 70 - 99 mg/dL    Comment: Glucose reference range applies only to samples taken after fasting for at least 8 hours.   BUN 11 6 - 20 mg/dL   Creatinine, Ser 4.54 0.61 - 1.24 mg/dL   Calcium 9.1 8.9 - 09.8 mg/dL   Total Protein 7.3 6.5 - 8.1 g/dL   Albumin 3.6 3.5 - 5.0 g/dL   AST 32 15 - 41 U/L   ALT 51 (H) 0 - 44 U/L   Alkaline Phosphatase 85 38 - 126 U/L   Total Bilirubin 0.7 <1.2 mg/dL   GFR, Estimated >11 >91 mL/min    Comment: (NOTE) Calculated using the CKD-EPI Creatinine Equation (2021)    Anion gap 10 5 - 15    Comment: Performed at [facility], [address], [place], [place] [address]  CBC with Differential     Status: None   Collection Time: 05/17/23 12:16 PM  Result Value Ref Range   WBC 6.9 4.0 - 10.5 K/uL   RBC 4.99 4.22 - 5.81 MIL/uL   Hemoglobin 15.8 13.0 - 17.0 g/dL   HCT 56.2 13.0 - 86.5 %   MCV 93.0 80.0 - 100.0 fL   MCH 31.7 26.0 - 34.0 pg   MCHC 34.1 30.0 - 36.0  g/dL   RDW 16.1 09.6 - 04.5 %   Platelets 289 150 - 400 K/uL   nRBC 0.0 0.0 - 0.2 %   Neutrophils Relative % 44 %   Neutro Abs 3.1 1.7 - 7.7 K/uL   Lymphocytes Relative 43 %   Lymphs Abs 3.0 0.7 - 4.0 K/uL   Monocytes Relative 9 %   Monocytes Absolute 0.6 0.1 - 1.0 K/uL   Eosinophils Relative 3 %   Eosinophils Absolute 0.2 0.0 - 0.5 K/uL   Basophils Relative 1 %   Basophils Absolute 0.0 0.0 - 0.1 K/uL   Immature Granulocytes 0 %   Abs Immature Granulocytes 0.02 0.00 - 0.07 K/uL    Comment: Performed at [facility], [address] [place], [place] [address]  Protime-INR     Status: None   Collection Time: 05/17/23 12:16 PM  Result Value Ref Range   Prothrombin Time 13.1 11.4 - 15.2 seconds   INR 1.0 0.8 - 1.2    Comment: (NOTE) INR goal varies based on device and disease states. Performed at [facility], [address] [place], [place] [address]   Type and screen [facility]     Status: None   Collection Time:  05/17/23 12:16 PM  Result Value Ref Range   ABO/RH(D) A POS    Antibody Screen NEG    Sample Expiration      05/20/2023,2359 Performed at [facility], [address] [place], [place] [address]   POC occult blood, ED     Status: Abnormal   Collection Time: 05/17/23  4:58 PM  Result Value Ref Range   Fecal Occult Bld POSITIVE (A) NEGATIVE   CT ABDOMEN PELVIS W CONTRAST Result Date: 05/17/2023 CLINICAL DATA:  Rectal bleeding x1 day EXAM: CT ABDOMEN AND PELVIS WITH CONTRAST TECHNIQUE: Multidetector CT imaging of the abdomen and pelvis was performed using the standard protocol following bolus administration of intravenous contrast. RADIATION DOSE REDUCTION: This exam was performed according to the departmental dose-optimization program which includes automated exposure control, adjustment of the mA and/or kV according to patient size and/or use of iterative reconstruction technique. CONTRAST:  75mL OMNIPAQUE IOHEXOL 350 MG/ML SOLN COMPARISON:  CTA abdomen/pelvis dated April 30, 2020 FINDINGS: Lower chest: Right basilar linear atelectasis/scarring. Hepatobiliary: Decreased attenuation of the hepatic parenchyma, compatible with hepatic steatosis. No suspicious focal liver lesion. No gallstones, gallbladder wall thickening, or biliary dilatation. Pancreas: Unremarkable. No pancreatic ductal dilatation or surrounding inflammatory changes. Spleen: Normal in size without focal abnormality. Adrenals/Urinary Tract: Adrenal glands are unremarkable. The kidneys enhance symmetrically. Stable right renal cysts. No suspicious focal lesion. No renal calculi or hydronephrosis. Bladder is unremarkable. Stomach/Bowel: Stomach is within normal limits. Appendix appears normal. No evidence of bowel wall thickening, distention, or inflammatory changes. Descending and sigmoid colonic diverticulosis without evidence of acute diverticulitis. Vascular/Lymphatic: The abdominal aorta is normal in caliber. Trace aortic  atherosclerosis. No enlarged abdominopelvic lymph nodes. Reproductive: Prostate is unremarkable. Other: Fat containing umbilical hernia. Small fat containing right inguinal hernia. No abdominopelvic ascites. No intraperitoneal free air. Musculoskeletal: No acute osseous abnormality. Serpiginous sclerotic change involving the weight-bearing aspect of the right femoral head is progressed since the prior exam and compatible with avascular necrosis. No definite subchondral collapse identified. IMPRESSION: 1. Descending and sigmoid colonic diverticulosis without evidence of acute diverticulitis. 2. Hepatic steatosis. 3. 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 Electronically Signed   By: Maryan Char.D.  On: 05/17/2023 16:22    Pending Labs Unresulted Labs (From admission, onward)    None       Vitals/Pain Today's Vitals   05/17/23 1153 05/17/23 1204 05/17/23 1542 05/17/23 1615  BP: 117/85  (!) 130/93 108/70  Pulse: 82  65 75  Resp: 16  14   Temp: 98.8 F (37.1 C)  98.2 F (36.8 C)   TempSrc:   Oral   SpO2: 99%  98% 98%  Weight:  115.7 kg    Height:  6\' 2"  (1.88 m)    PainSc:  8       Isolation Precautions No active isolations  Medications Medications  iohexol (OMNIPAQUE) 350 MG/ML injection 75 mL (75 mLs Intravenous Contrast Given 05/17/23 1433)    Mobility walks     Focused Assessments GI blood in stool   R Recommendations: See Admitting Provider Note  Report given to:   Additional Notes:

## 2023-05-17 NOTE — Assessment & Plan Note (Signed)
Currently in remission 

## 2023-05-17 NOTE — Assessment & Plan Note (Signed)
-   Suspect Lower Gi source  No hx of PUD,  melena,  BUN elevation to  suggest otherwise  - Admit  For further management given: gross rectal bleeding, rebleeding     -  most likely Diverticular source given ongoing rebleeding admit to progressive    -  ER  Provider spoke to gastroenterology ( [facility]) they will see patient in a.m. appreciate their consult   - serial CBC.    - Monitor for any recurrence,  evidence of hemodynamic instability or significant blood loss -  type and screen,  - Transfuse as needed for hemoglobin below 7 or <9 if evidence of significant  bleeding  - Establish at least 2 PIV and fluid resuscitate   - clear liquids for tonight keep nothing by mouth post midnight,

## 2023-05-17 NOTE — Assessment & Plan Note (Signed)
Order CIWA monitor for EtOh withdrawal

## 2023-05-17 NOTE — ED Provider Notes (Signed)
[place] EMERGENCY DEPARTMENT AT [facility] Provider Note   CSN: [id number] Arrival date & time: 05/17/23  1127     History  Chief Complaint  Patient presents with   Blood In Stools    Omar Cross is a 57 y.o. male.  HPI    57 year old male with medical history significant for PE not on anticoagulation who presents to the emergency department with concern for bright red blood per rectum.  The patient states that over the past 2 days he has had increasing amounts of blood in his stool.  Stool is bright red and his most recent bowel movement was more moderate to large volume.  He endorses some left lower quadrant pain, endorses chills, denies any fevers, no nausea or vomiting.  Denies any history of hemorrhoids.  Has a history of diverticulitis.   Home Medications Prior to Admission medications   Not on File      Allergies    Patient has no known allergies.    Review of Systems   Review of Systems  Gastrointestinal:  Positive for blood in stool.  All other systems reviewed and are negative.   Physical Exam Updated Vital Signs BP 108/70   Pulse 75   Temp 98.2 F (36.8 C) (Oral)   Resp 14   Ht 6\' 2"  (1.88 m)   Wt 115.7 kg   SpO2 98%   BMI 32.74 kg/m  Physical Exam Vitals and nursing note reviewed. Exam conducted with a chaperone present.  Constitutional:      General: He is not in acute distress.    Appearance: He is well-developed.  HENT:     Head: Normocephalic and atraumatic.  Eyes:     Conjunctiva/sclera: Conjunctivae normal.  Cardiovascular:     Rate and Rhythm: Normal rate and regular rhythm.  Pulmonary:     Effort: Pulmonary effort is normal. No respiratory distress.     Breath sounds: Normal breath sounds.  Abdominal:     Palpations: Abdomen is soft.     Tenderness: There is abdominal tenderness in the left lower quadrant.  Genitourinary:    Rectum: Guaiac result positive.     Comments: Hematochezia present, bright red  blood on the exam glove, no melena, no hemorrhoids.  Musculoskeletal:        General: No swelling.     Cervical back: Neck supple.  Skin:    General: Skin is warm and dry.     Capillary Refill: Capillary refill takes less than 2 seconds.  Neurological:     Mental Status: He is alert.  Psychiatric:        Mood and Affect: Mood normal.     ED Results / Procedures / Treatments   Labs (all labs ordered are listed, but only abnormal results are displayed) Labs Reviewed  COMPREHENSIVE METABOLIC PANEL - Abnormal; Notable for the following components:      Result Value   CO2 21 (*)    ALT 51 (*)    All other components within normal limits  POC OCCULT BLOOD, ED - Abnormal; Notable for the following components:   Fecal Occult Bld POSITIVE (*)    All other components within normal limits  CBC WITH DIFFERENTIAL/PLATELET  PROTIME-INR  TYPE AND SCREEN    EKG None  Radiology CT ABDOMEN PELVIS W CONTRAST Result Date: 05/17/2023 CLINICAL DATA:  Rectal bleeding x1 day EXAM: CT ABDOMEN AND PELVIS WITH CONTRAST TECHNIQUE: Multidetector CT imaging of the abdomen and pelvis was performed using  the standard protocol following bolus administration of intravenous contrast. RADIATION DOSE REDUCTION: This exam was performed according to the departmental dose-optimization program which includes automated exposure control, adjustment of the mA and/or kV according to patient size and/or use of iterative reconstruction technique. CONTRAST:  75mL OMNIPAQUE IOHEXOL 350 MG/ML SOLN COMPARISON:  CTA abdomen/pelvis dated April 30, 2020 FINDINGS: Lower chest: Right basilar linear atelectasis/scarring. Hepatobiliary: Decreased attenuation of the hepatic parenchyma, compatible with hepatic steatosis. No suspicious focal liver lesion. No gallstones, gallbladder wall thickening, or biliary dilatation. Pancreas: Unremarkable. No pancreatic ductal dilatation or surrounding inflammatory changes. Spleen: Normal in size  without focal abnormality. Adrenals/Urinary Tract: Adrenal glands are unremarkable. The kidneys enhance symmetrically. Stable right renal cysts. No suspicious focal lesion. No renal calculi or hydronephrosis. Bladder is unremarkable. Stomach/Bowel: Stomach is within normal limits. Appendix appears normal. No evidence of bowel wall thickening, distention, or inflammatory changes. Descending and sigmoid colonic diverticulosis without evidence of acute diverticulitis. Vascular/Lymphatic: The abdominal aorta is normal in caliber. Trace aortic atherosclerosis. No enlarged abdominopelvic lymph nodes. Reproductive: Prostate is unremarkable. Other: Fat containing umbilical hernia. Small fat containing right inguinal hernia. No abdominopelvic ascites. No intraperitoneal free air. Musculoskeletal: No acute osseous abnormality. Serpiginous sclerotic change involving the weight-bearing aspect of the right femoral head is progressed since the prior exam and compatible with avascular necrosis. No definite subchondral collapse identified. IMPRESSION: 1. Descending and sigmoid colonic diverticulosis without evidence of acute diverticulitis. 2. Hepatic steatosis. 3. 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 Electronically Signed   By: Hart Robinsons M.D.   On: 05/17/2023 16:22    Procedures Procedures    Medications Ordered in ED Medications  iohexol (OMNIPAQUE) 350 MG/ML injection 75 mL (75 mLs Intravenous Contrast Given 05/17/23 1433)    ED Course/ Medical Decision Making/ A&P                                 Medical Decision Making Risk Decision regarding hospitalization.     57 year old male with medical history significant for PE not on anticoagulation who presents to the emergency department with concern for bright red blood per rectum.  The patient states that over the past 2 days he has had increasing amounts of blood in his stool.  Stool is bright red and his most recent  bowel movement was more moderate to large volume.  He endorses some left lower quadrant pain, endorses chills, denies any fevers, no nausea or vomiting.  Denies any history of hemorrhoids.  Has a history of diverticulitis.  On arrival, the patient was vitally stable, afebrile, not tachycardic or tachypneic, BP 117/85, saturating 99% on room air.  Patient physical exam revealed hematochezia, no melena.  Fecal occult positive.  Abdomen with mild tenderness in the left lower quadrant.  Concern for diverticular bleed given increasing amount, history of diverticulosis.    Laboratory evaluation revealed CBC without a leukocytosis or anemia, hemoglobin stable at 15.8, patient remained vitally stable, INR normal, CMP without significant electrolyte abnormality, normal renal and liver function.  CT Abdomen Pelvis: IMPRESSION:  1. Descending and sigmoid colonic diverticulosis without evidence of  acute diverticulitis.  2. Hepatic steatosis.  3. Findings compatible with avascular necrosis of the right femoral  head.   I discussed the care of the patient with Dr. Marina Goodell of on-call gastroenterology who agreed for admission for observation in the setting of the patient's symptoms and presentation.  Hospitalist medicine consulted  for admission, patient and family updated regarding the plan of care.   Final Clinical Impression(s) / ED Diagnoses Final diagnoses:  Hematochezia  Lower GI bleed    Rx / DC Orders ED Discharge Orders     None         Ernie Avena, MD 05/17/23 1911

## 2023-05-17 NOTE — Assessment & Plan Note (Signed)
Easily reducible nonpainful CT abdomen nonacute continue to monitor

## 2023-05-17 NOTE — Assessment & Plan Note (Signed)
-   Spoke about importance of quitting spent 5 minutes discussing options for treatment, prior attempts at quitting, and dangers of smoking  -At this point patient is   NOT  interested in quitting  - declined nicotine patch   - nursing tobacco cessation protocol

## 2023-05-17 NOTE — H&P (Signed)
Omar Cross UJW:[id number] DOB: April 06, 1966 DOA: 05/17/2023     PCP: Julieanne Manson, MD   Outpatient Specialists: * NONE   Patient arrived to ER on 05/17/23 at 1127 Referred by Attending Ernie Avena, MD   Patient coming from:    home Lives With family      Chief Complaint:   Chief Complaint  Patient presents with   Blood In Stools    HPI: Omar Cross is a 57 y.o. male with medical history significant of Etoh abuse, tobacco abuse, diverticulitis  HLD, periumbilical hernia  Presented with    Pt presents with BRBPR for the past 2 days With left LLQ pain and chills no fever Has hx of diverticulitis, not on blood thinners No CP SOB Large bloody stool sent pt to ER Denies ever having gi bleed, never had a colonoscopy Reports he felt cold yesterday and this morning but no shaking chills Patient reports last large bloody BM was just 30 minutes prior to this examination He feels that he has progressively more bleeding but denies any lightheadedness or shortness of breath But now he is convinced to stay given significant blood per rectum No prior history of the same Denies any hematemesis no epigastric pain   significant ETOH intake  7-8 beers a day denies DT's Does  smoke  but not interested in quitting   Lab Results  Component Value Date   SARSCOV2NAA NEGATIVE 04/30/2020      Regarding pertinent Chronic problems:     Hyperlipidemia -  not   on statins  (pt denies hx of HLD) Lipid Panel     Component Value Date/Time   CHOL 260 (H) 03/11/2021 0948   TRIG 263 (H) 03/11/2021 0948   HDL 50 03/11/2021 0948   LDLCALC 161 (H) 03/11/2021 0948   LABVLDL 49 (H) 03/11/2021 0948     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 2. Hepatic steatosis. 3. Findings compatible with avascular necrosis of the right femoralnonacute   _______________________________________________________ ER Provider Called:      [facility] GI Dr.Perry  They Recommend admit to medicine   Will see in AM        ED Triage Vitals  Encounter Vitals Group     BP 05/17/23 1153 117/85     Systolic BP Percentile --      Diastolic BP Percentile --      Pulse Rate 05/17/23 1153 82     Resp 05/17/23 1153 16     Temp 05/17/23 1153 98.8 F (37.1 C)     Temp Source 05/17/23 1542 Oral     SpO2 05/17/23 1153 99 %     Weight 05/17/23 1204 255 lb (115.7 kg)     Height 05/17/23 1204 6\' 2"  (1.88 m)     Head Circumference --      Peak Flow --      Pain  Score 05/17/23 1204 8     Pain Loc --      Pain Education --      Exclude from Growth Chart --   OACZ(66)@     _________________________________________ Significant initial  Findings: Abnormal Labs Reviewed  COMPREHENSIVE METABOLIC PANEL - Abnormal; Notable for the following components:      Result Value   CO2 21 (*)    ALT 51 (*)    All other components within normal limits  POC OCCULT BLOOD, ED - Abnormal; Notable for the following components:   Fecal Occult Bld POSITIVE (*)    All other components within normal limits        ECG: Ordered    The recent clinical data is shown below. Vitals:   05/17/23 1153 05/17/23 1204 05/17/23 1542 05/17/23 1615  BP: 117/85  (!) 130/93 108/70  Pulse: 82  65 75  Resp: 16  14   Temp: 98.8 F (37.1 C)  98.2 F (36.8 C)   TempSrc:   Oral   SpO2: 99%  98% 98%  Weight:  115.7 kg    Height:  6\' 2"  (1.88 m)      WBC     Component Value Date/Time   WBC 6.9 05/17/2023 1216   LYMPHSABS 3.0 05/17/2023 1216   LYMPHSABS 2.7 03/11/2021 0948   MONOABS 0.6 05/17/2023 1216   EOSABS 0.2  05/17/2023 1216   EOSABS 0.3 03/11/2021 0948   BASOSABS 0.0 05/17/2023 1216   BASOSABS 0.0 03/11/2021 0948     Lactic Acid, Venous    Component Value Date/Time   LATICACIDVEN 1.7 04/30/2020 0232     UA  not ordered        __________________________________________________________ Recent Labs  Lab 05/17/23 1216  NA 138  K 4.3  CO2 21*  GLUCOSE 93  BUN 11  CREATININE 0.93  CALCIUM 9.1    Cr   stable,   Lab Results  Component Value Date   CREATININE 0.93 05/17/2023   CREATININE 0.98 03/11/2021   CREATININE 1.40 (H) 04/30/2020    Recent Labs  Lab 05/17/23 1216  AST 32  ALT 51*  ALKPHOS 85  BILITOT 0.7  PROT 7.3  ALBUMIN 3.6   Lab Results  Component Value Date   CALCIUM 9.1 05/17/2023    Plt: Lab Results  Component Value Date   PLT 289 05/17/2023    Recent Labs  Lab 05/17/23 1216  WBC 6.9  NEUTROABS 3.1  HGB 15.8  HCT 46.4  MCV 93.0  PLT 289    HG/HCT stable,     Component Value Date/Time   HGB 15.8 05/17/2023 1216   HGB 14.8 03/11/2021 0948   HCT 46.4 05/17/2023 1216   HCT 44.0 03/11/2021 0948   MCV 93.0 05/17/2023 1216   MCV 96 03/11/2021 0948    _______________________________________________ Hospitalist was called for admission for   Hematochezia  Lower GI bleed    The following Work up has been ordered so far:  Orders Placed This Encounter  Procedures   CT ABDOMEN PELVIS W CONTRAST   Comprehensive metabolic panel   CBC with Differential   Protime-INR   Diet NPO time specified   Orthostatic Vital signs   Initiate Carrier Fluid Protocol   Consult to gastroenterology   Consult for Unassigned Medical Admission   POC occult blood, ED   Type and screen [facility]   Insert peripheral IV     OTHER Significant initial  Findings:  labs showing:  DM  labs:  HbA1C: No results for input(s): "HGBA1C" in the last 8760 hours.     CBG (last 3)  No results for input(s): "GLUCAP" in the last 72 hours.         Cultures: No results found for: "SDES", "SPECREQUEST", "CULT", "REPTSTATUS"   Radiological Exams on Admission: CT ABDOMEN PELVIS W CONTRAST Result Date: 05/17/2023 CLINICAL DATA:  Rectal bleeding x1 day EXAM: CT ABDOMEN AND PELVIS WITH CONTRAST TECHNIQUE: Multidetector CT imaging of the abdomen and pelvis was performed using the standard protocol following bolus administration of intravenous contrast. RADIATION DOSE REDUCTION: This exam was performed according to the departmental dose-optimization program which includes automated exposure control, adjustment of the mA and/or kV according to patient size and/or use of iterative reconstruction technique. CONTRAST:  75mL OMNIPAQUE IOHEXOL 350 MG/ML SOLN COMPARISON:  CTA abdomen/pelvis dated April 30, 2020 FINDINGS: Lower chest: Right basilar linear atelectasis/scarring. Hepatobiliary: Decreased attenuation of the hepatic parenchyma, compatible with hepatic steatosis. No suspicious focal liver lesion. No gallstones, gallbladder wall thickening, or biliary dilatation. Pancreas: Unremarkable. No pancreatic ductal dilatation or surrounding inflammatory changes. Spleen: Normal in size without focal abnormality. Adrenals/Urinary Tract: Adrenal glands are unremarkable. The kidneys enhance symmetrically. Stable right renal cysts. No suspicious focal lesion. No renal calculi or hydronephrosis. Bladder is unremarkable. Stomach/Bowel: Stomach is within normal limits. Appendix appears normal. No evidence of bowel wall thickening, distention, or inflammatory changes. Descending and sigmoid colonic diverticulosis without evidence of acute diverticulitis. Vascular/Lymphatic: The abdominal aorta is normal in caliber. Trace aortic atherosclerosis. No enlarged abdominopelvic lymph nodes. Reproductive: Prostate is unremarkable. Other: Fat containing umbilical hernia. Small fat containing right inguinal hernia. No abdominopelvic ascites. No intraperitoneal free air.  Musculoskeletal: No acute osseous abnormality. Serpiginous sclerotic change involving the weight-bearing aspect of the right femoral head is progressed since the prior exam and compatible with avascular necrosis. No definite subchondral collapse identified. IMPRESSION: 1. Descending and sigmoid colonic diverticulosis without evidence of acute diverticulitis. 2. Hepatic steatosis. 3. 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 Electronically Signed   By: Hart Robinsons M.D.   On: 05/17/2023 16:22   _______________________________________________________________________________________________________ Latest  Blood pressure 108/70, pulse 75, temperature 98.2 F (36.8 C), temperature source Oral, resp. rate 14, height 6\' 2"  (1.88 m), weight 115.7 kg, SpO2 98%.   Vitals  labs and radiology finding personally reviewed  Review of Systems:    Pertinent positives include:     blood in stool, Constitutional:  No weight loss, night sweats, Fevers, chills, fatigue, weight loss  HEENT:  No headaches, Difficulty swallowing,Tooth/dental problems,Sore throat,  No sneezing, itching, ear ache, nasal congestion, post nasal drip,  Cardio-vascular:  No chest pain, Orthopnea, PND, anasarca, dizziness, palpitations.no Bilateral lower extremity swelling  GI:  No heartburn, indigestion, abdominal pain, nausea, vomiting, diarrhea, change in bowel habits, loss of appetite, melena, hematemesis Resp:  no shortness of breath at rest. No dyspnea on exertion, No excess mucus, no productive cough, No non-productive cough, No coughing up of blood.No change in color of mucus.No wheezing. Skin:  no rash or lesions. No jaundice GU:  no dysuria, change in color of urine, no urgency or frequency. No straining to urinate.  No flank pain.  Musculoskeletal:  No joint pain or no joint swelling. No decreased range of motion. No back pain.  Psych:  No change in mood or affect. No depression or  anxiety. No memory loss.  Neuro: no localizing neurological complaints, no tingling, no weakness, no double vision, no gait  abnormality, no slurred speech, no confusion  All systems reviewed and apart from HOPI all are negative _______________________________________________________________________________________________ Past Medical History:   Past Medical History:  Diagnosis Date   Cancer (HCC)    Fibrosarcoma   Pulmonary embolism (HCC)    Sciatic pain       Past Surgical History:  Procedure Laterality Date   ANKLE SURGERY     excision of fibrosarcoma,low back  03/01/2001   [facility].  No adjuvant therapy.   lipoma surgeries      Social History:  Ambulatory   independently     reports that he has been smoking cigarettes. He has a 17 pack-year smoking history. He has never used smokeless tobacco. He reports current alcohol use. He reports that he does not currently use drugs after having used the following drugs: "Crack" cocaine.   Family History:   Family History  Problem Relation Age of Onset   Hypertension Mother    Healthy Father    ________________________________________________________________________________________ Allergies: No Known Allergies   Prior to Admission medications   Not on File    ___________________________________________________________________________________________________ Physical Exam:    05/17/2023    4:15 PM 05/17/2023    3:42 PM 05/17/2023   12:04 PM  Vitals with BMI  Height   6\' 2"   Weight   255 lbs  BMI   32.73  Systolic 108 130   Diastolic 70 93   Pulse 75 65     1. General:  in No   Acute distress   well    -appearing 2. Psychological: Alert and    Oriented 3. Head/ENT:    Dry Mucous Membranes                          Head Non traumatic, neck supple                    Poor Dentition 4. SKIN:  decreased Skin turgor,  Skin clean Dry and intact no rash    5. Heart: Regular rate and rhythm no  Murmur, no Rub or  gallop 6. Lungs:  bilaterally, no wheezes or crackles   7. Abdomen: Soft,  non-tender, Non distended  bowel sounds present.  periumbilical reducible hernia 8. Lower extremities: no clubbing, cyanosis, no  edema 9. Neurologically Grossly intact, moving all 4 extremities equally  10. MSK: Normal range of motion    Chart has been reviewed  ______________________________________________________________________________________________  Assessment/Plan  57 y.o. male with medical history significant of Etoh abuse, tobacco abuse, diverticulitis  HLD, periumbilical hernia   Admitted for  Lower GI bleed     Present on Admission:  Lower GI bleed  Fibrosarcoma, s/p surgery  Tobacco abuse  Alcohol abuse  Periumbilical hernia     Fibrosarcoma, s/p surgery Currently in remission  Lower GI bleed - Suspect Lower Gi source  No hx of PUD,  melena,  BUN elevation to  suggest otherwise  - Admit  For further management given: gross rectal bleeding, rebleeding     -  most likely Diverticular source given ongoing rebleeding admit to progressive    -  ER  Provider spoke to gastroenterology ( [facility]) they will see patient in a.m. appreciate their consult   - serial CBC.    - Monitor for any recurrence,  evidence of hemodynamic instability or significant blood loss -  type and screen,  - Transfuse as needed for hemoglobin below 7 or <9 if evidence of significant  bleeding  - Establish at least 2 PIV and fluid resuscitate   - clear liquids for tonight keep nothing by mouth post midnight,     Tobacco abuse  - Spoke about importance of quitting spent 5 minutes discussing options for treatment, prior attempts at quitting, and dangers of smoking  -At this point patient is   NOT  interested in quitting  - declined nicotine patch   - nursing tobacco cessation protocol   Alcohol abuse Order CIWA monitor for EtOh withdrawal  Periumbilical hernia Easily reducible nonpainful CT abdomen nonacute  continue to monitor   Other plan as per orders.  DVT prophylaxis:  SCD    Code Status: DNR/DNI as per patient  I had personally discussed CODE STATUS with patient and family  ACP   none     Family Communication:   Family  at  Bedside  plan of care was discussed   with   Wife,   Diet  Diet Orders (From admission, onward)     Start     Ordered   05/17/23 1211  Diet NPO time specified  Diet effective now        05/17/23 1210            Disposition Plan:       To home once workup is complete and patient is stable   Following barriers for discharge:                             h/H stable                                                     Will need consultants to evaluate patient prior to discharge      Consults called:     Treatment Team:  Pyrtle, Carie Caddy, MD  Admission status:  ED Disposition     ED Disposition  Admit   Condition  --   Comment  Hospital Area: [facility] [[id number]]  Level of Care: Progressive [102]  Admit to Progressive based on following criteria: GI, ENDOCRINE disease patients with GI bleeding, acute liver failure or pancreatitis, stable with diabetic ketoacidosis or thyrotoxicosis (hypothyroid) state.  May place patient in observation at [facility] or [facility] if equivalent level of care is available:: No  Covid Evaluation: Asymptomatic - no recent exposure (last 10 days) testing not required  Diagnosis: Lower GI bleed [[id number]]  Admitting Physician: Therisa Doyne [[id number]]  Attending Physician: Therisa Doyne [[id number]]           Obs     Level of care    progressive      tele indefinitely please discontinue once patient no longer qualifies COVID-19 Labs    Lab Results  Component Value Date   SARSCOV2NAA NEGATIVE 04/30/2020      Adien Kimmel 05/17/2023, 8:07 PM    [facility]     after 2 AM please page floor coverage PA If 7AM-7PM, please contact the day team taking care of the patient  using [email or web address]

## 2023-05-17 NOTE — ED Provider Triage Note (Addendum)
Emergency Medicine Provider Triage Evaluation Note  DONNA DECOTEAU , a 57 y.o. male  was evaluated in triage.  Pt complains of bloody stool. Started two day ago. Blood is bright red and states "there's has been a lot of it". Also reports generalized lower abdominal pain. Also reports remote history of diverticulitis.  Review of Systems  Positive: See above Negative: See above  Physical Exam  BP 117/85 (BP Location: Right Arm)   Pulse 82   Temp 98.8 F (37.1 C)   Resp 16   Ht 6\' 2"  (1.88 m)   Wt 115.7 kg   SpO2 99%   BMI 32.74 kg/m  Gen:   Awake, no distress   Resp:  Normal effort  MSK:   Moves extremities without difficulty  Other:    Medical Decision Making  Medically screening exam initiated at 12:09 PM.  Appropriate orders placed.  Ellender Hose was informed that the remainder of the evaluation will be completed by another provider, this initial triage assessment does not replace that evaluation, and the importance of remaining in the ED until their evaluation is complete.  Work up started   Gareth Eagle, PA-C 05/17/23 1210    Gareth Eagle, PA-C 05/17/23 [phone]

## 2023-05-17 NOTE — ED Triage Notes (Signed)
Pt reports BRB in stools for 2 days with some lower abd pain, hx of diverticulitis years ago.

## 2023-05-17 NOTE — Subjective & Objective (Signed)
Pt presents with BRBPR for the past 2 days With left LLQ pain and chills no fever Has hx of diverticulitis, not on blood thinners No CP SOB Large bloody stool sent pt to ER

## 2023-05-18 ENCOUNTER — Other Ambulatory Visit (HOSPITAL_COMMUNITY): Payer: Self-pay

## 2023-05-18 DIAGNOSIS — K625 Hemorrhage of anus and rectum: Secondary | ICD-10-CM

## 2023-05-18 DIAGNOSIS — K760 Fatty (change of) liver, not elsewhere classified: Secondary | ICD-10-CM

## 2023-05-18 LAB — COMPREHENSIVE METABOLIC PANEL
ALT: 45 U/L — ABNORMAL HIGH (ref 0–44)
AST: 30 U/L (ref 15–41)
Albumin: 3.3 g/dL — ABNORMAL LOW (ref 3.5–5.0)
Alkaline Phosphatase: 75 U/L (ref 38–126)
Anion gap: 7 (ref 5–15)
BUN: 11 mg/dL (ref 6–20)
CO2: 23 mmol/L (ref 22–32)
Calcium: 8.7 mg/dL — ABNORMAL LOW (ref 8.9–10.3)
Chloride: 105 mmol/L (ref 98–111)
Creatinine, Ser: 0.88 mg/dL (ref 0.61–1.24)
GFR, Estimated: >60 mL/min (ref >60)
Glucose, Bld: 96 mg/dL (ref 70–99)
Potassium: 4 mmol/L (ref 3.5–5.1)
Sodium: 135 mmol/L (ref 135–145)
Total Bilirubin: 0.6 mg/dL (ref <1.2)
Total Protein: 6.6 g/dL (ref 6.5–8.1)

## 2023-05-18 LAB — MAGNESIUM: Magnesium: 2.1 mg/dL (ref 1.7–2.4)

## 2023-05-18 LAB — I-STAT CG4 LACTIC ACID, ED: Lactic Acid, Venous: 0.7 mmol/L (ref 0.5–1.9)

## 2023-05-18 LAB — CBC
HCT: 43 % (ref 39.0–52.0)
HCT: 41.2 % (ref 39.0–52.0)
Hemoglobin: 14.5 g/dL (ref 13.0–17.0)
Hemoglobin: 14 g/dL (ref 13.0–17.0)
MCH: 31.1 pg (ref 26.0–34.0)
MCH: 31.7 pg (ref 26.0–34.0)
MCHC: 33.7 g/dL (ref 30.0–36.0)
MCHC: 34 g/dL (ref 30.0–36.0)
MCV: 92.3 fL (ref 80.0–100.0)
MCV: 93.4 fL (ref 80.0–100.0)
Platelets: 278 10*3/uL (ref 150–400)
Platelets: 290 10*3/uL (ref 150–400)
RBC: 4.66 MIL/uL (ref 4.22–5.81)
RBC: 4.41 MIL/uL (ref 4.22–5.81)
RDW: 13.5 % (ref 11.5–15.5)
RDW: 13.2 % (ref 11.5–15.5)
WBC: 7.4 10*3/uL (ref 4.0–10.5)
WBC: 7.3 10*3/uL (ref 4.0–10.5)
nRBC: 0 % (ref 0.0–0.2)
nRBC: 0 % (ref 0.0–0.2)

## 2023-05-18 LAB — HIV ANTIBODY (ROUTINE TESTING W REFLEX): HIV Screen 4th Generation wRfx: NONREACTIVE

## 2023-05-18 LAB — PHOSPHORUS: Phosphorus: 4 mg/dL (ref 2.5–4.6)

## 2023-05-18 LAB — ETHANOL: Alcohol, Ethyl (B): <10 mg/dL (ref <10)

## 2023-05-18 MED ORDER — THIAMINE MONONITRATE 100 MG PO TABS
100.0000 mg | ORAL_TABLET | Freq: Every day | ORAL | Status: DC
  Administered 2023-05-18: 100 mg via ORAL
  Filled 2023-05-18: qty 1

## 2023-05-18 MED ORDER — THIAMINE HCL 100 MG/ML IJ SOLN: 100.0000 mg | Freq: Every day | INTRAMUSCULAR | Status: DC

## 2023-05-18 MED ORDER — ONDANSETRON HCL 4 MG/2ML IJ SOLN: 4.0000 mg | Freq: Four times a day (QID) | INTRAMUSCULAR | Status: DC | PRN

## 2023-05-18 MED ORDER — LORAZEPAM 2 MG/ML IJ SOLN: 1.0000 mg | INTRAMUSCULAR | Status: DC | PRN

## 2023-05-18 MED ORDER — ONDANSETRON HCL 4 MG PO TABS: 4.0000 mg | ORAL_TABLET | Freq: Four times a day (QID) | ORAL | Status: DC | PRN

## 2023-05-18 MED ORDER — ADULT MULTIVITAMIN W/MINERALS CH
1.0000 | ORAL_TABLET | Freq: Every day | ORAL | Status: DC
  Administered 2023-05-18: 1 via ORAL
  Filled 2023-05-18: qty 1

## 2023-05-18 MED ORDER — LORAZEPAM 1 MG PO TABS: 1.0000 mg | ORAL_TABLET | ORAL | Status: DC | PRN

## 2023-05-18 MED ORDER — HYDROCODONE-ACETAMINOPHEN 5-325 MG PO TABS
1.0000 | ORAL_TABLET | ORAL | Status: DC | PRN
Start: 2023-05-18 — End: 2023-05-18

## 2023-05-18 MED ORDER — FOLIC ACID 1 MG PO TABS
1.0000 mg | ORAL_TABLET | Freq: Every day | ORAL | Status: DC
  Administered 2023-05-18: 1 mg via ORAL
  Filled 2023-05-18: qty 1

## 2023-05-18 MED ORDER — SODIUM CHLORIDE 0.9 % IV SOLN
INTRAVENOUS | Status: AC
Start: 2023-05-18 — End: 2023-05-18

## 2023-05-18 NOTE — ED Notes (Signed)
Pt ambulated with pulse ox; no O2 desaturations, SHOB, unsteady gait, or abnormalities noted.

## 2023-05-18 NOTE — Discharge Summary (Signed)
Physician Discharge Summary   Patient: Omar Cross MRN: [id number] DOB: 1965-12-19  Admit date:     05/17/2023  Discharge date: 05/18/23  Discharge Physician: Debarah Crape   PCP: Julieanne Manson, MD   Recommendations at discharge:    Follow up with GI for colonoscopy Follow-up with orthopedic surgery to evaluate right femur avascular necrosis  Discharge Diagnoses: Principal Problem:   Lower GI bleed Active Problems:   Fibrosarcoma, s/p surgery   Alcohol abuse   Tobacco abuse   Periumbilical hernia  Resolved Problems:   * No resolved hospital problems. 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  Patient has had painless bloody bowel movements over the last 3 days. In the ED he was hemodynamically stable, hemoglobin found to be 14.5 and did not downtrend significantly.  FOBT was expectedly positive.  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 GI was consulted.  Given patient's hemodynamic stability, and hemoglobin of 14, discussion of risk and benefit was had with the patient regarding admission for colonoscopy or performing outpatient.  Through shared decision making patient opted to follow-up with the gastroenterology team outpatient for colonoscopy.  During his ER stay his bloody bowel movements slowed significantly.  He did not have any other large-volume bloody bowel movements. We discussed the incidental finding of right femoral avascular necrosis.  Patient denies any consistent right hip pain.  He has been referred to orthopedic surgery and will follow-up outpatient. I reiterated the importance of following up closely  with GI as well as primary care for routine cancer screenings, including colonoscopy.  I reiterated return precautions if patient begins bleeding again.  He endorses understanding and will schedule outpatient colonoscopy within the coming weeks.   Consultants: Gastroenterology Procedures performed: none  Disposition: Home Diet recommendation:  Discharge Diet Orders (From admission, onward)     Start     Ordered   05/18/23 0000  Diet - low sodium heart healthy        05/18/23 1548           Regular diet DISCHARGE MEDICATION: Allergies as of 05/18/2023   No Known Allergies      Medication List    You have not been prescribed any medications.     Discharge Exam: Filed Weights   05/17/23 1204  Weight: 115.7 kg   Constitutional:  Normal appearance. Non toxic-appearing.  HENT: Head Normocephalic and atraumatic.  Mucous membranes are moist.  Eyes:  Extraocular intact. Conjunctivae normal. Pupils are equal, round, and reactive to light.  Cardiovascular: Rate and Rhythm: Normal rate and regular rhythm.  Pulmonary: Non labored, symmetric rise of chest wall.  Musculoskeletal:  Normal range of motion.  Skin: warm and dry. not jaundiced.  Neurological: No focal deficit present. alert. Oriented. Psychiatric: Mood and Affect congruent.    Condition at discharge: good  The results of significant diagnostics from this hospitalization (including imaging, microbiology, ancillary and laboratory) are listed below for reference.   Imaging Studies: CT ABDOMEN PELVIS W CONTRAST Result Date: 05/17/2023 CLINICAL DATA:  Rectal bleeding x1 day EXAM: CT ABDOMEN AND PELVIS WITH CONTRAST TECHNIQUE: Multidetector CT imaging of the abdomen and pelvis was performed using the standard protocol following bolus administration of intravenous contrast. RADIATION DOSE  REDUCTION: This exam was performed according to the departmental dose-optimization program which includes automated exposure control,  adjustment of the mA and/or kV according to patient size and/or use of iterative reconstruction technique. CONTRAST:  75mL OMNIPAQUE IOHEXOL 350 MG/ML SOLN COMPARISON:  CTA abdomen/pelvis dated April 30, 2020 FINDINGS: Lower chest: Right basilar linear atelectasis/scarring. Hepatobiliary: Decreased attenuation of the hepatic parenchyma, compatible with hepatic steatosis. No suspicious focal liver lesion. No gallstones, gallbladder wall thickening, or biliary dilatation. Pancreas: Unremarkable. No pancreatic ductal dilatation or surrounding inflammatory changes. Spleen: Normal in size without focal abnormality. Adrenals/Urinary Tract: Adrenal glands are unremarkable. The kidneys enhance symmetrically. Stable right renal cysts. No suspicious focal lesion. No renal calculi or hydronephrosis. Bladder is unremarkable. Stomach/Bowel: Stomach is within normal limits. Appendix appears normal. No evidence of bowel wall thickening, distention, or inflammatory changes. Descending and sigmoid colonic diverticulosis without evidence of acute diverticulitis. Vascular/Lymphatic: The abdominal aorta is normal in caliber. Trace aortic atherosclerosis. No enlarged abdominopelvic lymph nodes. Reproductive: Prostate is unremarkable. Other: Fat containing umbilical hernia. Small fat containing right inguinal hernia. No abdominopelvic ascites. No intraperitoneal free air. Musculoskeletal: No acute osseous abnormality. Serpiginous sclerotic change involving the weight-bearing aspect of the right femoral head is progressed since the prior exam and compatible with avascular necrosis. No definite subchondral collapse identified. IMPRESSION: 1. Descending and sigmoid colonic diverticulosis without evidence of acute diverticulitis. 2. Hepatic steatosis. 3. 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 Electronically Signed   By: Hart Robinsons M.D.   On: 05/17/2023 16:22    Microbiology: Results for orders  placed or performed during the hospital encounter of 04/30/20  Resp Panel by RT-PCR (Flu A&B, Covid) Nasopharyngeal Swab     Status: None   Collection Time: 04/30/20 12:41 AM   Specimen: Nasopharyngeal Swab; Nasopharyngeal(NP) swabs in vial transport medium  Result Value Ref Range Status   SARS Coronavirus 2 by RT PCR NEGATIVE NEGATIVE Final    Comment: (NOTE) SARS-CoV-2 target nucleic acids are NOT DETECTED.  The SARS-CoV-2 RNA is generally detectable in upper respiratory specimens during the acute phase of infection. The lowest concentration of SARS-CoV-2 viral copies this assay can detect is 138 copies/mL. A negative result does not preclude SARS-Cov-2 infection and should not be used as the sole basis for treatment or other patient management decisions. A negative result may occur with  improper specimen collection/handling, submission of specimen other than nasopharyngeal swab, presence of viral mutation(s) within the areas targeted by this assay, and inadequate number of viral copies(<138 copies/mL). A negative result must be combined with clinical observations, patient history, and epidemiological information. The expected result is Negative.  Fact Sheet for Patients:  BloggerCourse.com  Fact Sheet for Healthcare Providers:  SeriousBroker.it  This test is no t yet approved or cleared by the [place] FDA and  has been authorized for detection and/or diagnosis of SARS-CoV-2 by FDA under an Emergency Use Authorization (EUA). This EUA will remain  in effect (meaning this test can be used) for the duration of the COVID-19 declaration under Section 564(b)(1) of the Act, 21 U.S.C.section 360bbb-3(b)(1), unless the authorization is terminated  or revoked sooner.       Influenza A by PCR NEGATIVE NEGATIVE Final   Influenza B by PCR NEGATIVE NEGATIVE Final    Comment: (NOTE) The Xpert Xpress SARS-CoV-2/FLU/RSV plus assay is  intended as an aid in the diagnosis of influenza from Nasopharyngeal swab specimens and should not be used as a sole basis for treatment. Nasal washings  and aspirates are unacceptable for Xpert Xpress SARS-CoV-2/FLU/RSV testing.  Fact Sheet for Patients: BloggerCourse.com  Fact Sheet for Healthcare Providers: SeriousBroker.it  This test is not yet approved or cleared by the [place] FDA and has been authorized for detection and/or diagnosis of SARS-CoV-2 by FDA under an Emergency Use Authorization (EUA). This EUA will remain in effect (meaning this test can be used) for the duration of the COVID-19 declaration under Section 564(b)(1) of the Act, 21 U.S.C. section 360bbb-3(b)(1), unless the authorization is terminated or revoked.  Performed at [facility], [address], [place], [place] [address]     Labs: CBC: Recent Labs  Lab 05/17/23 1216 05/17/23 2215 05/18/23 0704 05/18/23 1317  WBC 6.9 7.9 7.4 7.3  NEUTROABS 3.1  --   --   --   HGB 15.8 14.2 14.5 14.0  HCT 46.4 42.2 43.0 41.2  MCV 93.0 92.7 92.3 93.4  PLT 289 272 278 290   Basic Metabolic Panel: Recent Labs  Lab 05/17/23 1216 05/17/23 2215 05/18/23 0703  NA 138  --  135  K 4.3  --  4.0  CL 107  --  105  CO2 21*  --  23  GLUCOSE 93  --  96  BUN 11  --  11  CREATININE 0.93  --  0.88  CALCIUM 9.1  --  8.7*  MG  --  2.1 2.1  PHOS  --  2.9 4.0   Liver Function Tests: Recent Labs  Lab 05/17/23 1216 05/18/23 0703  AST 32 30  ALT 51* 45*  ALKPHOS 85 75  BILITOT 0.7 0.6  PROT 7.3 6.6  ALBUMIN 3.6 3.3*   CBG: No results for input(s): "GLUCAP" in the last 168 hours.  Discharge time spent: greater than 30 minutes.  Signed: Debarah Crape, DO [facility] 05/18/2023

## 2023-05-18 NOTE — Discharge Instructions (Signed)

## 2023-05-18 NOTE — Progress Notes (Signed)
   05/18/23 1544  TOC Brief Assessment  Insurance and Status Reviewed (MATCH completed)  Patient has primary care physician Yes (Available  Julieanne Manson, MD)  Home environment has been reviewed From home with Wife  Prior level of function: independent  Prior/Current Home Services No current home services  Social Drivers of Health Review SDOH reviewed interventions complete (Smoking cessation information on DC instructions)  Readmission risk has been reviewed Yes (N/A listed)  Transition of care needs transition of care needs identified, TOC will continue to follow (MATCH Letter)   TOC will continue to follow patient for any additional discharge needs

## 2023-05-18 NOTE — TOC Progression Note (Signed)
Transition of Care [facility]) - Progression Note    Patient Details  Name: Omar Cross MRN: [id number] Date of Birth: 1966-05-12  Transition of Care [facility]) CM/SW Contact  Gordy Clement, RN Phone Number: 05/18/2023, 3:59 PM  Clinical Narrative:     MATCH MEDICATION ASSISTANCE CARD Pharmacies please call [phone] for claim processing assistance.  Rx BIN: [id number] Rx Group: [id number] Rx PCN: [id number] Relationship Code: 1 Person Code: 01  Patient ID (MRN):   [id number]      Patient Name: Omar Cross   Patient DOB: November 21, 1965   Discharge Date: 05/19/2023  Expiration Date:05/26/2023 (must be filled within 7 days of discharge)   Dear [name] have been approved to have the prescriptions written by your discharging physician filled through our [facility] (Medication Assistance Through [facility]) program. This program allows for a one-time (no refills) 34-day supply of selected medications for a low copay amount.  The copay is $3.00 per prescription. For instance, if you have one prescription, you will pay $3.00; for two prescriptions, you pay $6.00; for three prescriptions, you pay $9.00; and so on. Only certain pharmacies are participating in this program with [facility]. You will need to select one of the pharmacies from the attached lists and take your prescriptions, this letter, and your photo ID to one of the participating pharmacies.  We are excited that you are able to use the [facility] program to get your medications. These prescriptions must be filled within 7 days of hospital discharge or they will no longer be valid for the [facility] program. Should you have any problems with your prescriptions please contact your case management team member at [phone] for /[place] or [phone] for [facility].  Thank you, [facility] Health    [facility] Program Pharmacies [place]. [facility], [facility], [facility]  [facility] Pharmacies [address] [place], [place] [address] [place], [place] [address], [place] [address] [place], [address], [place] Other [facility] [address] [place], [facility] [place] [address] [facility] [address], [place]                         Expected Discharge Plan and Services         Expected Discharge Date: 05/18/23                                     Social Determinants of Health (SDOH) Interventions SDOH Screenings   Food Insecurity: No Food Insecurity (05/17/2023)  Housing: Low Risk  (05/17/2023)  Transportation Needs: No Transportation Needs (05/17/2023)  Utilities: Not At Risk (05/17/2023)  Financial Resource Strain: Low Risk  (01/09/2019)  Social Connections: Unknown (10/12/2021)   Received from [facility]  Tobacco Use: High Risk (05/17/2023)    Readmission Risk Interventions     No data to display

## 2023-05-18 NOTE — TOC CM/SW Note (Signed)
MATCH MEDICATION ASSISTANCE CARD Pharmacies please call [phone] for claim processing assistance.  Rx BIN: [id number] Rx Group: [id number] Rx PCN: [id number] Relationship Code: 1 Person Code: 01  Patient ID (MRN):   [id number]      Patient Name: Omar Cross   Patient DOB: 02/27/1966   Discharge Date: 05/19/2023  Expiration Date:05/26/2023 (must be filled within 7 days of discharge)   Dear [name] have been approved to have the prescriptions written by your discharging physician filled through our [facility] (Medication Assistance Through [facility]) program. This program allows for a one-time (no refills) 34-day supply of selected medications for a low copay amount.  The copay is $3.00 per prescription. For instance, if you have one prescription, you will pay $3.00; for two prescriptions, you pay $6.00; for three prescriptions, you pay $9.00; and so on. Only certain pharmacies are participating in this program with [facility]. You will need to select one of the pharmacies from the attached lists and take your prescriptions, this letter, and your photo ID to one of the participating pharmacies.  We are excited that you are able to use the [facility] program to get your medications. These prescriptions must be filled within 7 days of hospital discharge or they will no longer be valid for the [facility] program. Should you have any problems with your prescriptions please contact your case management team member at [phone] for [place] or [phone] for [facility].  Thank you, [facility] Health    [facility] Program Pharmacies [place]. [facility], [facility], [facility]  [facility] Pharmacies [address] [place], [place] [address] [address] [place], [place] [address], [place], [employer] [address] [place], [address], [place] Other [facility] [address] [place], [place] [facility] [address]  [facility] [id number], [place]

## 2023-05-18 NOTE — Hospital Course (Signed)
Omar Cross is a 57 year old male with history of alcohol abuse, tobacco abuse, diverticulosis, history of PE not on anticoagulation, HLD, periumbilical hernia, presents with bright red blood per rectum.  Patient has had painless bloody bowel movements over the last 3 days. In the ED he was hemodynamically stable, hemoglobin found to be 14.5 and did not downtrend significantly.  FOBT was expectedly positive.  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 GI was consulted.  Given patient's hemodynamic stability, and hemoglobin of 14, discussion of risk and benefit was had with the patient regarding admission for colonoscopy or performing outpatient.  Through shared decision making patient opted to follow-up with the gastroenterology team outpatient for colonoscopy.  During his ER stay his bloody bowel movements slowed significantly.  He did not have any other large-volume bloody bowel movements. We discussed the incidental finding of right femoral avascular necrosis.  Patient denies any consistent right hip pain.  He has been referred to orthopedic surgery and will follow-up outpatient. I reiterated the importance of following up closely with GI as well as primary care for routine cancer screenings, including colonoscopy.  I reiterated return precautions if patient begins bleeding again.  He endorses understanding and will schedule outpatient colonoscopy within the coming weeks.

## 2023-05-18 NOTE — ED Notes (Signed)
ED TO INPATIENT HANDOFF REPORT  ED Nurse Name and Phone #: Ashok Norris [phone]  S Name/Age/Gender Omar Cross 57 y.o. male Room/Bed: 037C/037C  Code Status   Code Status: Full Code  Home/SNF/Other Home Patient oriented to: self, place, time, and situation Is this baseline? Yes   Triage Complete: Triage complete  Chief Complaint Lower GI bleed [K92.2]  Triage Note Pt reports BRB in stools for 2 days with some lower abd pain, hx of diverticulitis years ago.    Allergies No Known Allergies  Level of Care/Admitting Diagnosis ED Disposition     ED Disposition  Admit   Condition  --   Comment  Hospital Area: [facility] [[id number]]  Level of Care: Progressive [102]  Admit to Progressive based on following criteria: GI, ENDOCRINE disease patients with GI bleeding, acute liver failure or pancreatitis, stable with diabetic ketoacidosis or thyrotoxicosis (hypothyroid) state.  May place patient in observation at [facility] or [facility] if equivalent level of care is available:: No  Covid Evaluation: Asymptomatic - no recent exposure (last 10 days) testing not required  Diagnosis: Lower GI bleed [427062]  Admitting Physician: Therisa Doyne [[id number]]  Attending Physician: Therisa Doyne [[id number]]          B Medical/Surgery History Past Medical History:  Diagnosis Date   Cancer (HCC)    Fibrosarcoma   Pulmonary embolism (HCC)    Sciatic pain    Past Surgical History:  Procedure Laterality Date   ANKLE SURGERY     excision of fibrosarcoma,low back  03/01/2001   [facility].  No adjuvant therapy.   lipoma surgeries       A IV Location/Drains/Wounds Patient Lines/Drains/Airways Status     Active Line/Drains/Airways     Name Placement date Placement time Site Days   Peripheral IV 05/17/23 20 G Left Antecubital 05/17/23  1221  Antecubital  1            Intake/Output Last 24 hours No intake or output data in the 24 hours  ending 05/18/23 1321  Labs/Imaging Results for orders placed or performed during the hospital encounter of 05/17/23 (from the past 48 hours)  Comprehensive metabolic panel     Status: Abnormal   Collection Time: 05/17/23 12:16 PM  Result Value Ref Range   Sodium 138 135 - 145 mmol/L   Potassium 4.3 3.5 - 5.1 mmol/L   Chloride 107 98 - 111 mmol/L   CO2 21 (L) 22 - 32 mmol/L   Glucose, Bld 93 70 - 99 mg/dL    Comment: Glucose reference range applies only to samples taken after fasting for at least 8 hours.   BUN 11 6 - 20 mg/dL   Creatinine, Ser 3.76 0.61 - 1.24 mg/dL   Calcium 9.1 8.9 - 28.3 mg/dL   Total Protein 7.3 6.5 - 8.1 g/dL   Albumin 3.6 3.5 - 5.0 g/dL   AST 32 15 - 41 U/L   ALT 51 (H) 0 - 44 U/L   Alkaline Phosphatase 85 38 - 126 U/L   Total Bilirubin 0.7 <1.2 mg/dL   GFR, Estimated >15 >17 mL/min    Comment: (NOTE) Calculated using the CKD-EPI Creatinine Equation (2021)    Anion gap 10 5 - 15    Comment: Performed at [facility], [address], [place], [place] [address]  CBC with Differential     Status: None   Collection Time: 05/17/23 12:16 PM  Result Value Ref Range   WBC  6.9 4.0 - 10.5 K/uL   RBC 4.99 4.22 - 5.81 MIL/uL   Hemoglobin 15.8 13.0 - 17.0 g/dL   HCT 16.1 09.6 - 04.5 %   MCV 93.0 80.0 - 100.0 fL   MCH 31.7 26.0 - 34.0 pg   MCHC 34.1 30.0 - 36.0 g/dL   RDW 40.9 81.1 - 91.4 %   Platelets 289 150 - 400 K/uL   nRBC 0.0 0.0 - 0.2 %   Neutrophils Relative % 44 %   Neutro Abs 3.1 1.7 - 7.7 K/uL   Lymphocytes Relative 43 %   Lymphs Abs 3.0 0.7 - 4.0 K/uL   Monocytes Relative 9 %   Monocytes Absolute 0.6 0.1 - 1.0 K/uL   Eosinophils Relative 3 %   Eosinophils Absolute 0.2 0.0 - 0.5 K/uL   Basophils Relative 1 %   Basophils Absolute 0.0 0.0 - 0.1 K/uL   Immature Granulocytes 0 %   Abs Immature Granulocytes 0.02 0.00 - 0.07 K/uL    Comment: Performed at [facility], [address] [place], [place] [address]  Protime-INR     Status:  None   Collection Time: 05/17/23 12:16 PM  Result Value Ref Range   Prothrombin Time 13.1 11.4 - 15.2 seconds   INR 1.0 0.8 - 1.2    Comment: (NOTE) INR goal varies based on device and disease states. Performed at [facility], [address] [place], [place] [address]   Type and screen [facility]     Status: None   Collection Time: 05/17/23 12:16 PM  Result Value Ref Range   ABO/RH(D) A POS    Antibody Screen NEG    Sample Expiration      05/20/2023,2359 Performed at [facility], [address] [place], [place] [address]   POC occult blood, ED     Status: Abnormal   Collection Time: 05/17/23  4:58 PM  Result Value Ref Range   Fecal Occult Bld POSITIVE (A) NEGATIVE  Magnesium     Status: None   Collection Time: 05/17/23 10:15 PM  Result Value Ref Range   Magnesium 2.1 1.7 - 2.4 mg/dL    Comment: Performed at [facility], [address], [place], [place] [address]  Phosphorus     Status: None   Collection Time: 05/17/23 10:15 PM  Result Value Ref Range   Phosphorus 2.9 2.5 - 4.6 mg/dL    Comment: Performed at [facility], [address], [place], [place] [address]  CBC     Status: None   Collection Time: 05/17/23 10:15 PM  Result Value Ref Range   WBC 7.9 4.0 - 10.5 K/uL   RBC 4.55 4.22 - 5.81 MIL/uL   Hemoglobin 14.2 13.0 - 17.0 g/dL   HCT 13.2 44.0 - 10.2 %   MCV 92.7 80.0 - 100.0 fL   MCH 31.2 26.0 - 34.0 pg   MCHC 33.6 30.0 - 36.0 g/dL   RDW 72.5 36.6 - 44.0 %   Platelets 272 150 - 400 K/uL   nRBC 0.0 0.0 - 0.2 %    Comment: Performed at [facility], [address], [place], [place] [address]  I-Stat CG4 Lactic Acid     Status: None   Collection Time: 05/17/23 10:34 PM  Result Value Ref Range   Lactic Acid, Venous 1.6 0.5 - 1.9 mmol/L  Magnesium     Status: None   Collection Time: 05/18/23  7:03  AM  Result Value Ref Range   Magnesium 2.1 1.7 - 2.4 mg/dL    Comment: Performed at [facility],  [address] [place], [place] [address]  Phosphorus     Status: None   Collection Time: 05/18/23  7:03 AM  Result Value Ref Range   Phosphorus 4.0 2.5 - 4.6 mg/dL    Comment: Performed at [facility], [address], [place], [place] [address]  Comprehensive metabolic panel     Status: Abnormal   Collection Time: 05/18/23  7:03 AM  Result Value Ref Range   Sodium 135 135 - 145 mmol/L   Potassium 4.0 3.5 - 5.1 mmol/L   Chloride 105 98 - 111 mmol/L   CO2 23 22 - 32 mmol/L   Glucose, Bld 96 70 - 99 mg/dL    Comment: Glucose reference range applies only to samples taken after fasting for at least 8 hours.   BUN 11 6 - 20 mg/dL   Creatinine, Ser 7.82 0.61 - 1.24 mg/dL   Calcium 8.7 (L) 8.9 - 10.3 mg/dL   Total Protein 6.6 6.5 - 8.1 g/dL   Albumin 3.3 (L) 3.5 - 5.0 g/dL   AST 30 15 - 41 U/L   ALT 45 (H) 0 - 44 U/L   Alkaline Phosphatase 75 38 - 126 U/L   Total Bilirubin 0.6 <1.2 mg/dL   GFR, Estimated >95 >62 mL/min    Comment: (NOTE) Calculated using the CKD-EPI Creatinine Equation (2021)    Anion gap 7 5 - 15    Comment: Performed at [facility], [address] [place], [place] [address]  CBC     Status: None   Collection Time: 05/18/23  7:04 AM  Result Value Ref Range   WBC 7.4 4.0 - 10.5 K/uL   RBC 4.66 4.22 - 5.81 MIL/uL   Hemoglobin 14.5 13.0 - 17.0 g/dL   HCT 57.8 46.9 - 62.9 %   MCV 92.3 80.0 - 100.0 fL   MCH 31.1 26.0 - 34.0 pg   MCHC 33.7 30.0 - 36.0 g/dL   RDW 52.8 41.3 - 24.4 %   Platelets 278 150 - 400 K/uL   nRBC 0.0 0.0 - 0.2 %    Comment: Performed at [facility], [address], [place], [place] [address]  Ethanol     Status: None   Collection Time: 05/18/23  7:04 AM  Result Value Ref Range   Alcohol, Ethyl (B) <10 <10 mg/dL    Comment: (NOTE) Lowest detectable limit for serum alcohol is 10 mg/dL.  For medical purposes only. Performed at [facility], [address], [place], [place] [address]   I-Stat CG4 Lactic Acid      Status: None   Collection Time: 05/18/23  7:14 AM  Result Value Ref Range   Lactic Acid, Venous 0.7 0.5 - 1.9 mmol/L   CT ABDOMEN PELVIS W CONTRAST Result Date: 05/17/2023 CLINICAL DATA:  Rectal bleeding x1 day EXAM: CT ABDOMEN AND PELVIS WITH CONTRAST TECHNIQUE: Multidetector CT imaging of the abdomen and pelvis was performed using the standard protocol following bolus administration of intravenous contrast. RADIATION DOSE REDUCTION: This exam was performed according to the departmental dose-optimization program which includes automated exposure control, adjustment of the mA and/or kV according to patient size and/or use of iterative reconstruction technique. CONTRAST:  75mL OMNIPAQUE IOHEXOL 350 MG/ML SOLN COMPARISON:  CTA abdomen/pelvis dated April 30, 2020 FINDINGS: Lower chest: Right basilar linear atelectasis/scarring.  Hepatobiliary: Decreased attenuation of the hepatic parenchyma, compatible with hepatic steatosis. No suspicious focal liver lesion. No gallstones, gallbladder wall thickening, or biliary dilatation. Pancreas: Unremarkable. No pancreatic ductal dilatation or surrounding inflammatory changes. Spleen: Normal in size without focal abnormality. Adrenals/Urinary Tract: Adrenal glands are unremarkable. The kidneys enhance symmetrically. Stable right renal cysts. No suspicious focal lesion. No renal calculi or hydronephrosis. Bladder is unremarkable. Stomach/Bowel: Stomach is within normal limits. Appendix appears normal. No evidence of bowel wall thickening, distention, or inflammatory changes. Descending and sigmoid colonic diverticulosis without evidence of acute diverticulitis. Vascular/Lymphatic: The abdominal aorta is normal in caliber. Trace aortic atherosclerosis. No enlarged abdominopelvic lymph nodes. Reproductive: Prostate is unremarkable. Other: Fat containing umbilical hernia. Small fat containing right inguinal hernia. No abdominopelvic ascites. No intraperitoneal free air.  Musculoskeletal: No acute osseous abnormality. Serpiginous sclerotic change involving the weight-bearing aspect of the right femoral head is progressed since the prior exam and compatible with avascular necrosis. No definite subchondral collapse identified. IMPRESSION: 1. Descending and sigmoid colonic diverticulosis without evidence of acute diverticulitis. 2. Hepatic steatosis. 3. 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 Electronically Signed   By: Hart Robinsons M.D.   On: 05/17/2023 16:22    Pending Labs Unresulted Labs (From admission, onward)     Start     Ordered   05/18/23 0740  HIV Antibody (routine testing w rflx)  Once,   R        05/18/23 0740   05/17/23 1917  CBC  Now then every 6 hours,   R (with TIMED occurrences)     Question:  Release to patient  Answer:  Immediate   05/17/23 1917            Vitals/Pain Today's Vitals   05/18/23 0802 05/18/23 1110 05/18/23 1218 05/18/23 1317  BP: 105/81 120/87 118/86 130/83  Pulse: 71 79 74 87  Resp: 15 18 16 18   Temp: 98.3 F (36.8 C)  98.7 F (37.1 C) 98 F (36.7 C)  TempSrc: Oral  Oral Oral  SpO2: 100% 100% 100% 100%  Weight:      Height:      PainSc:   0-No pain     Isolation Precautions No active isolations  Medications Medications  0.9 %  sodium chloride infusion ( Intravenous New Bag/Given 05/18/23 0812)  acetaminophen (TYLENOL) tablet 650 mg (650 mg Oral Given 05/17/23 2317)    Or  acetaminophen (TYLENOL) suppository 650 mg ( Rectal See Alternative 05/17/23 2317)  ondansetron (ZOFRAN) tablet 4 mg (has no administration in time range)    Or  ondansetron (ZOFRAN) injection 4 mg (has no administration in time range)  HYDROcodone-acetaminophen (NORCO/VICODIN) 5-325 MG per tablet 1-2 tablet (has no administration in time range)  pantoprazole (PROTONIX) injection 40 mg (40 mg Intravenous Given 05/17/23 2125)  LORazepam (ATIVAN) tablet 1-4 mg (has no administration in time range)     Or  LORazepam (ATIVAN) injection 1-4 mg (has no administration in time range)  thiamine (VITAMIN B1) tablet 100 mg (100 mg Oral Given 05/18/23 1152)    Or  thiamine (VITAMIN B1) injection 100 mg ( Intravenous See Alternative 05/18/23 1152)  folic acid (FOLVITE) tablet 1 mg (1 mg Oral Given 05/18/23 1152)  multivitamin with minerals tablet 1 tablet (1 tablet Oral Given 05/18/23 1151)  iohexol (OMNIPAQUE) 350 MG/ML injection 75 mL (75 mLs Intravenous Contrast Given 05/17/23 1433)    Mobility walks     Focused Assessments     R Recommendations:  See Admitting Provider Note  Report given to:   Additional Notes:

## 2023-05-18 NOTE — Consult Note (Signed)
Consultation Note   Referring Provider:  [facility] PCP: Julieanne Manson, MD Primary Gastroenterologist: Gentry Fitz        Reason for Consultation: Hematochezia  DOA: 05/17/2023         Hospital Day: 2   ASSESSMENT    Brief Narrative:  57 y.o. year old male with a pulmonary embolism not anticoagulated, tobacco abuse, Etoh abuse, fibrosarcoma   Painless rectal bleeding. Probably hemorrhoidal bleeding. Diverticular bleed also possible. Colon neoplasm possible but less likely  Etoh abuse, drinks several beers / day.  No evidence for cirrhosis on imaging or suggested by exam or labs  Chronic, mild elevation in ALT in setting of hepatic steatosis.   Principal Problem:   Lower GI bleed Active Problems:   Fibrosarcoma, s/p surgery   Alcohol abuse   Tobacco abuse   Periumbilical hernia     PLAN:   --Patient needs a colonoscopy. His hemoglobin has remained normal and bleeding seems to be resolving. He would like to have done as outpatient. Will discuss with Dr. Rhea Belton, probably best to do inpatient.  --Keep on clear liquids only  HPI   Omar Cross presented to the ED last evening with painless rectal bleeding with BMs. Stool ? Dark but not black. Bleeding started on Sunday. Overall has had about 6 episodes since onset. Thisa me bleeding has slowed significantly . Prior to the onset of bleeding he was not constipated. He describes normal BMs He has been taking several doses of Excedrin over the last few days. Otherwise no regular use of NSAIDs. No other GI complaints.   An uncle had colon cancer. Patient has never had a colonoscopy    ED Labs / Imaging / Events    --Hemoccult positive stool --ALT 51, remainder of LFTs normal -- CBC normal with hemoglobin of 15.8 -- INR 1 -- EtOH less than 10 --CT AP w contrast -left-sided diverticulosis, hepatic steatosis,     avascular necrosis of right femoral head  Previous GI  Evaluations   None  Labs and Imaging: Recent Labs    05/17/23 1216 05/17/23 2215 05/18/23 0704  WBC 6.9 7.9 7.4  HGB 15.8 14.2 14.5  HCT 46.4 42.2 43.0  PLT 289 272 278   Recent Labs    05/17/23 1216 05/18/23 0703  NA 138 135  K 4.3 4.0  CL 107 105  CO2 21* 23  GLUCOSE 93 96  BUN 11 11  CREATININE 0.93 0.88  CALCIUM 9.1 8.7*   Recent Labs    05/18/23 0703  PROT 6.6  ALBUMIN 3.3*  AST 30  ALT 45*  ALKPHOS 75  BILITOT 0.6   No results for input(s): "HEPBSAG", "HCVAB", "HEPAIGM", "HEPBIGM" in the last 72 hours. Recent Labs    05/17/23 1216  LABPROT 13.1  INR 1.0    Past Medical History:  Diagnosis Date   Cancer (HCC)    Fibrosarcoma   Pulmonary embolism (HCC)    Sciatic pain     Past Surgical History:  Procedure Laterality Date   ANKLE SURGERY     excision of fibrosarcoma,low back  03/01/2001   [facility].  No adjuvant therapy.   lipoma surgeries      Family History  Problem Relation Age of Onset  Hypertension Mother    Healthy Father     Prior to Admission medications   Not on File    Current Facility-Administered Medications  Medication Dose Route Frequency Provider Last Rate Last Admin   0.9 %  sodium chloride infusion   Intravenous Continuous Therisa Doyne, MD 100 mL/hr at 05/18/23 0812 New Bag at 05/18/23 4540   acetaminophen (TYLENOL) tablet 650 mg  650 mg Oral Q6H PRN Therisa Doyne, MD   650 mg at 05/17/23 2317   Or   acetaminophen (TYLENOL) suppository 650 mg  650 mg Rectal Q6H PRN Therisa Doyne, MD       folic acid (FOLVITE) tablet 1 mg  1 mg Oral Daily Doutova, Anastassia, MD       HYDROcodone-acetaminophen (NORCO/VICODIN) 5-325 MG per tablet 1-2 tablet  1-2 tablet Oral Q4H PRN Doutova, Anastassia, MD       LORazepam (ATIVAN) tablet 1-4 mg  1-4 mg Oral Q1H PRN Therisa Doyne, MD       Or   LORazepam (ATIVAN) injection 1-4 mg  1-4 mg Intravenous Q1H PRN Doutova, Anastassia, MD       multivitamin with minerals  tablet 1 tablet  1 tablet Oral Daily Doutova, Anastassia, MD       ondansetron (ZOFRAN) tablet 4 mg  4 mg Oral Q6H PRN Doutova, Anastassia, MD       Or   ondansetron (ZOFRAN) injection 4 mg  4 mg Intravenous Q6H PRN Doutova, Anastassia, MD       pantoprazole (PROTONIX) injection 40 mg  40 mg Intravenous QHS Doutova, Anastassia, MD   40 mg at 05/17/23 2125   thiamine (VITAMIN B1) tablet 100 mg  100 mg Oral Daily Doutova, Anastassia, MD       Or   thiamine (VITAMIN B1) injection 100 mg  100 mg Intravenous Daily Doutova, Anastassia, MD       No current outpatient medications on file.    Allergies as of 05/17/2023   (No Known Allergies)    Social History   Socioeconomic History   Marital status: Married    Spouse name: Omar Cross   Number of children: 5   Years of education: 12   Highest education level: High school graduate  Occupational History   Not on file  Tobacco Use   Smoking status: Every Day    Current packs/day: 0.50    Average packs/day: 0.5 packs/day for 34.0 years (17.0 ttl pk-yrs)    Types: Cigarettes   Smokeless tobacco: Never  Vaping Use   Vaping status: Never Used  Substance and Sexual Activity   Alcohol use: Yes    Comment: 03/2021:  now half glass of wine watered down.   Drug use: Not Currently    Types: "Crack" cocaine    Comment: Last used in 2017   Sexual activity: Yes  Other Topics Concern   Not on file  Social History Narrative   Lives at home for past 8 months with his wife.   She goes back and forth from [place].   They were separated for a couple of years.   Many problems with adult children currently   Friend who had an MI currently living with them   Social Drivers of Health   Financial Resource Strain: Low Risk  (01/09/2019)   Overall Financial Resource Strain (CARDIA)    Difficulty of Paying Living Expenses: Not hard at all  Food Insecurity: No Food Insecurity (05/17/2023)   Hunger Vital Sign    Worried About Running  Out of Food in the  Last Year: Never true    Ran Out of Food in the Last Year: Never true  Transportation Needs: No Transportation Needs (05/17/2023)   PRAPARE - Administrator, Civil Service (Medical): No    Lack of Transportation (Non-Medical): No  Physical Activity: Not on file  Stress: Not on file  Social Connections: Unknown (10/12/2021)   Received from [facility]   Social Network    Social Network: Not on file  Intimate Partner Violence: Not At Risk (05/17/2023)   Humiliation, Afraid, Rape, and Kick questionnaire    Fear of Current or Ex-Partner: No    Emotionally Abused: No    Physically Abused: No    Sexually Abused: No     Code Status   Code Status: Limited: Do not attempt resuscitation (DNR) -DNR-LIMITED -Do Not Intubate/DNI   Review of Systems: All systems reviewed and negative except where noted in HPI.  Physical Exam: Vital signs in last 24 hours: Temp:  [97.9 F (36.6 C)-99.2 F (37.3 C)] 98.3 F (36.8 C) (12/17 0802) Pulse Rate:  [65-85] 71 (12/17 0802) Resp:  [14-20] 15 (12/17 0802) BP: (105-130)/(69-93) 105/81 (12/17 0802) SpO2:  [94 %-100 %] 100 % (12/17 0802) Weight:  [115.7 kg] 115.7 kg (12/16 1204)    General:  Pleasant male in NAD Psych:  Cooperative. Normal mood and affect Eyes: Pupils equal Ears:  Normal auditory acuity Nose: No deformity, discharge or lesions Neck:  Supple, no masses felt Lungs:  Clear to auscultation.  Heart:  Regular rate, regular rhythm.  Abdomen:  Soft, nondistended, nontender, active bowel sounds, no masses felt Rectal :  Scant amount of pink tinged fluid in vault.  Msk: Symmetrical without gross deformities.  Neurologic:  Alert, oriented, grossly normal neurologically Extremities : No edema Skin:  Intact without significant lesions.    Intake/Output from previous day: No intake/output data recorded. Intake/Output this shift:  No intake/output data recorded.   Willette Cluster, NP-C   05/18/2023, 8:47 AM

## 2023-05-18 NOTE — ED Notes (Signed)
ED TO INPATIENT HANDOFF REPORT  ED Nurse Name and Phone #: Sara/Maijor Hornig [phone]  S Name/Age/Gender Omar Cross 57 y.o. male Room/Bed: 037C/037C  Code Status   Code Status: Full Code  Home/SNF/Other Home Patient oriented to: self, place, time, and situation Is this baseline? Yes   Triage Complete: Triage complete  Chief Complaint Lower GI bleed [K92.2]  Triage Note Pt reports BRB in stools for 2 days with some lower abd pain, hx of diverticulitis years ago.    Allergies No Known Allergies  Level of Care/Admitting Diagnosis ED Disposition     ED Disposition  Admit   Condition  --   Comment  Hospital Area: [facility] [[id number]]  Level of Care: Progressive [102]  Admit to Progressive based on following criteria: GI, ENDOCRINE disease patients with GI bleeding, acute liver failure or pancreatitis, stable with diabetic ketoacidosis or thyrotoxicosis (hypothyroid) state.  May place patient in observation at [facility] or [facility] if equivalent level of care is available:: No  Covid Evaluation: Asymptomatic - no recent exposure (last 10 days) testing not required  Diagnosis: Lower GI bleed [147829]  Admitting Physician: Therisa Doyne [[id number]]  Attending Physician: Therisa Doyne [[id number]]          B Medical/Surgery History Past Medical History:  Diagnosis Date   Cancer (HCC)    Fibrosarcoma   Pulmonary embolism (HCC)    Sciatic pain    Past Surgical History:  Procedure Laterality Date   ANKLE SURGERY     excision of fibrosarcoma,low back  03/01/2001   [facility].  No adjuvant therapy.   lipoma surgeries       A IV Location/Drains/Wounds Patient Lines/Drains/Airways Status     Active Line/Drains/Airways     Name Placement date Placement time Site Days   Peripheral IV 05/17/23 20 G Left Antecubital 05/17/23  1221  Antecubital  1            Intake/Output Last 24 hours No intake or output data in the 24 hours ending  05/18/23 1213  Labs/Imaging Results for orders placed or performed during the hospital encounter of 05/17/23 (from the past 48 hours)  Comprehensive metabolic panel     Status: Abnormal   Collection Time: 05/17/23 12:16 PM  Result Value Ref Range   Sodium 138 135 - 145 mmol/L   Potassium 4.3 3.5 - 5.1 mmol/L   Chloride 107 98 - 111 mmol/L   CO2 21 (L) 22 - 32 mmol/L   Glucose, Bld 93 70 - 99 mg/dL    Comment: Glucose reference range applies only to samples taken after fasting for at least 8 hours.   BUN 11 6 - 20 mg/dL   Creatinine, Ser 5.62 0.61 - 1.24 mg/dL   Calcium 9.1 8.9 - 13.0 mg/dL   Total Protein 7.3 6.5 - 8.1 g/dL   Albumin 3.6 3.5 - 5.0 g/dL   AST 32 15 - 41 U/L   ALT 51 (H) 0 - 44 U/L   Alkaline Phosphatase 85 38 - 126 U/L   Total Bilirubin 0.7 <1.2 mg/dL   GFR, Estimated >86 >57 mL/min    Comment: (NOTE) Calculated using the CKD-EPI Creatinine Equation (2021)    Anion gap 10 5 - 15    Comment: Performed at [facility], [address] [place], [place] [address]  CBC with Differential     Status: None   Collection Time: 05/17/23 12:16 PM  Result Value Ref Range   WBC 6.9 4.0 -  10.5 K/uL   RBC 4.99 4.22 - 5.81 MIL/uL   Hemoglobin 15.8 13.0 - 17.0 g/dL   HCT 82.9 56.2 - 13.0 %   MCV 93.0 80.0 - 100.0 fL   MCH 31.7 26.0 - 34.0 pg   MCHC 34.1 30.0 - 36.0 g/dL   RDW 86.5 78.4 - 69.6 %   Platelets 289 150 - 400 K/uL   nRBC 0.0 0.0 - 0.2 %   Neutrophils Relative % 44 %   Neutro Abs 3.1 1.7 - 7.7 K/uL   Lymphocytes Relative 43 %   Lymphs Abs 3.0 0.7 - 4.0 K/uL   Monocytes Relative 9 %   Monocytes Absolute 0.6 0.1 - 1.0 K/uL   Eosinophils Relative 3 %   Eosinophils Absolute 0.2 0.0 - 0.5 K/uL   Basophils Relative 1 %   Basophils Absolute 0.0 0.0 - 0.1 K/uL   Immature Granulocytes 0 %   Abs Immature Granulocytes 0.02 0.00 - 0.07 K/uL    Comment: Performed at [facility], [address], [place], [place] [address]  Protime-INR     Status: None    Collection Time: 05/17/23 12:16 PM  Result Value Ref Range   Prothrombin Time 13.1 11.4 - 15.2 seconds   INR 1.0 0.8 - 1.2    Comment: (NOTE) INR goal varies based on device and disease states. Performed at [facility], [address] [place], [place] [address]   Type and screen [facility]     Status: None   Collection Time: 05/17/23 12:16 PM  Result Value Ref Range   ABO/RH(D) A POS    Antibody Screen NEG    Sample Expiration      05/20/2023,2359 Performed at [facility], [address], [place], [place] [address]   POC occult blood, ED     Status: Abnormal   Collection Time: 05/17/23  4:58 PM  Result Value Ref Range   Fecal Occult Bld POSITIVE (A) NEGATIVE  Magnesium     Status: None   Collection Time: 05/17/23 10:15 PM  Result Value Ref Range   Magnesium 2.1 1.7 - 2.4 mg/dL    Comment: Performed at [facility], [address], [place], [place] [address]  Phosphorus     Status: None   Collection Time: 05/17/23 10:15 PM  Result Value Ref Range   Phosphorus 2.9 2.5 - 4.6 mg/dL    Comment: Performed at [facility], [address], [place], [place] [address]  CBC     Status: None   Collection Time: 05/17/23 10:15 PM  Result Value Ref Range   WBC 7.9 4.0 - 10.5 K/uL   RBC 4.55 4.22 - 5.81 MIL/uL   Hemoglobin 14.2 13.0 - 17.0 g/dL   HCT 47.4 25.9 - 56.3 %   MCV 92.7 80.0 - 100.0 fL   MCH 31.2 26.0 - 34.0 pg   MCHC 33.6 30.0 - 36.0 g/dL   RDW 87.5 64.3 - 32.9 %   Platelets 272 150 - 400 K/uL   nRBC 0.0 0.0 - 0.2 %    Comment: Performed at [facility], [address] [place], [place] [address]  I-Stat CG4 Lactic Acid     Status: None   Collection Time: 05/17/23 10:34 PM  Result Value Ref Range   Lactic Acid, Venous 1.6 0.5 - 1.9 mmol/L  Magnesium     Status: None   Collection Time: 05/18/23  7:03 AM  Result  Value Ref Range   Magnesium 2.1 1.7 - 2.4 mg/dL    Comment: Performed at [facility], [address] [place], [place] [address]  Phosphorus     Status: None   Collection Time: 05/18/23  7:03 AM  Result Value Ref Range   Phosphorus 4.0 2.5 - 4.6 mg/dL    Comment: Performed at [facility], [address] [place], [place] [address]  Comprehensive metabolic panel     Status: Abnormal   Collection Time: 05/18/23  7:03 AM  Result Value Ref Range   Sodium 135 135 - 145 mmol/L   Potassium 4.0 3.5 - 5.1 mmol/L   Chloride 105 98 - 111 mmol/L   CO2 23 22 - 32 mmol/L   Glucose, Bld 96 70 - 99 mg/dL    Comment: Glucose reference range applies only to samples taken after fasting for at least 8 hours.   BUN 11 6 - 20 mg/dL   Creatinine, Ser 5.78 0.61 - 1.24 mg/dL   Calcium 8.7 (L) 8.9 - 10.3 mg/dL   Total Protein 6.6 6.5 - 8.1 g/dL   Albumin 3.3 (L) 3.5 - 5.0 g/dL   AST 30 15 - 41 U/L   ALT 45 (H) 0 - 44 U/L   Alkaline Phosphatase 75 38 - 126 U/L   Total Bilirubin 0.6 <1.2 mg/dL   GFR, Estimated >46 >96 mL/min    Comment: (NOTE) Calculated using the CKD-EPI Creatinine Equation (2021)    Anion gap 7 5 - 15    Comment: Performed at [facility], [address], [place], [place] [address]  CBC     Status: None   Collection Time: 05/18/23  7:04 AM  Result Value Ref Range   WBC 7.4 4.0 - 10.5 K/uL   RBC 4.66 4.22 - 5.81 MIL/uL   Hemoglobin 14.5 13.0 - 17.0 g/dL   HCT 41.3 24.4 - 01.0 %   MCV 92.3 80.0 - 100.0 fL   MCH 31.1 26.0 - 34.0 pg   MCHC 33.7 30.0 - 36.0 g/dL   RDW 27.2 53.6 - 64.4 %   Platelets 278 150 - 400 K/uL   nRBC 0.0 0.0 - 0.2 %    Comment: Performed at [facility], [address] [place], [place] [address]  Ethanol     Status: None   Collection Time: 05/18/23  7:04 AM  Result Value Ref Range   Alcohol, Ethyl (B) <10 <10 mg/dL    Comment: (NOTE) Lowest detectable limit for serum alcohol is 10 mg/dL.  For medical purposes only. Performed at [facility], [address] [place], [place] [address]   I-Stat CG4 Lactic Acid     Status: None    Collection Time: 05/18/23  7:14 AM  Result Value Ref Range   Lactic Acid, Venous 0.7 0.5 - 1.9 mmol/L   CT ABDOMEN PELVIS W CONTRAST Result Date: 05/17/2023 CLINICAL DATA:  Rectal bleeding x1 day EXAM: CT ABDOMEN AND PELVIS WITH CONTRAST TECHNIQUE: Multidetector CT imaging of the abdomen and pelvis was performed using the standard protocol following bolus administration of intravenous contrast. RADIATION DOSE REDUCTION: This exam was performed according to the departmental dose-optimization program which includes automated exposure control, adjustment of the mA and/or kV according to patient size and/or use of iterative reconstruction technique. CONTRAST:  75mL OMNIPAQUE IOHEXOL 350 MG/ML SOLN COMPARISON:  CTA abdomen/pelvis dated April 30, 2020 FINDINGS: Lower chest: Right basilar linear atelectasis/scarring. Hepatobiliary: Decreased attenuation  of the hepatic parenchyma, compatible with hepatic steatosis. No suspicious focal liver lesion. No gallstones, gallbladder wall thickening, or biliary dilatation. Pancreas: Unremarkable. No pancreatic ductal dilatation or surrounding inflammatory changes. Spleen: Normal in size without focal abnormality. Adrenals/Urinary Tract: Adrenal glands are unremarkable. The kidneys enhance symmetrically. Stable right renal cysts. No suspicious focal lesion. No renal calculi or hydronephrosis. Bladder is unremarkable. Stomach/Bowel: Stomach is within normal limits. Appendix appears normal. No evidence of bowel wall thickening, distention, or inflammatory changes. Descending and sigmoid colonic diverticulosis without evidence of acute diverticulitis. Vascular/Lymphatic: The abdominal aorta is normal in caliber. Trace aortic atherosclerosis. No enlarged abdominopelvic lymph nodes. Reproductive: Prostate is unremarkable. Other: Fat containing umbilical hernia. Small fat containing right inguinal hernia. No abdominopelvic ascites. No intraperitoneal free air. Musculoskeletal: No  acute osseous abnormality. Serpiginous sclerotic change involving the weight-bearing aspect of the right femoral head is progressed since the prior exam and compatible with avascular necrosis. No definite subchondral collapse identified. IMPRESSION: 1. Descending and sigmoid colonic diverticulosis without evidence of acute diverticulitis. 2. Hepatic steatosis. 3. 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 Electronically Signed   By: Hart Robinsons M.D.   On: 05/17/2023 16:22    Pending Labs Unresulted Labs (From admission, onward)     Start     Ordered   05/18/23 0740  HIV Antibody (routine testing w rflx)  Once,   R        05/18/23 0740   05/17/23 1917  CBC  Now then every 6 hours,   R (with TIMED occurrences)     Question:  Release to patient  Answer:  Immediate   05/17/23 1917            Vitals/Pain Today's Vitals   05/18/23 0530 05/18/23 0755 05/18/23 0802 05/18/23 1110  BP: 113/69  105/81 120/87  Pulse: 74 75 71 79  Resp: 20 17 15 18   Temp:   98.3 F (36.8 C)   TempSrc:   Oral   SpO2: 94% 100% 100% 100%  Weight:      Height:      PainSc:        Isolation Precautions No active isolations  Medications Medications  0.9 %  sodium chloride infusion ( Intravenous New Bag/Given 05/18/23 0812)  acetaminophen (TYLENOL) tablet 650 mg (650 mg Oral Given 05/17/23 2317)    Or  acetaminophen (TYLENOL) suppository 650 mg ( Rectal See Alternative 05/17/23 2317)  ondansetron (ZOFRAN) tablet 4 mg (has no administration in time range)    Or  ondansetron (ZOFRAN) injection 4 mg (has no administration in time range)  HYDROcodone-acetaminophen (NORCO/VICODIN) 5-325 MG per tablet 1-2 tablet (has no administration in time range)  pantoprazole (PROTONIX) injection 40 mg (40 mg Intravenous Given 05/17/23 2125)  LORazepam (ATIVAN) tablet 1-4 mg (has no administration in time range)    Or  LORazepam (ATIVAN) injection 1-4 mg (has no administration in time  range)  thiamine (VITAMIN B1) tablet 100 mg (100 mg Oral Given 05/18/23 1152)    Or  thiamine (VITAMIN B1) injection 100 mg ( Intravenous See Alternative 05/18/23 1152)  folic acid (FOLVITE) tablet 1 mg (1 mg Oral Given 05/18/23 1152)  multivitamin with minerals tablet 1 tablet (1 tablet Oral Given 05/18/23 1151)  iohexol (OMNIPAQUE) 350 MG/ML injection 75 mL (75 mLs Intravenous Contrast Given 05/17/23 1433)    Mobility walks     Focused Assessments GI   R Recommendations: See Admitting Provider Note  Report given to:   Additional  Notes:

## 2023-05-18 NOTE — Progress Notes (Signed)
CSW received consult for substance abuse resources - resources were added to patient's AVS.  Edwin Dada, MSW, LCSW Transitions of Care  Clinical Social Worker II [phone]

## 2023-05-18 NOTE — TOC Transition Note (Signed)
Transition of Care [facility]) - Discharge Note   Patient Details  Name: Omar Cross MRN: [id number] Date of Birth: Jan 14, 1966  Transition of Care [facility]) CM/SW Contact:  Gordy Clement, RN Phone Number: 05/18/2023, 4:00 PM   Clinical Narrative:     Patient to DC to home today MATCH completed  No additional TOC needs          Patient Goals and CMS Choice            Discharge Placement                       Discharge Plan and Services Additional resources added to the After Visit Summary for                                       Social Drivers of Health (SDOH) Interventions SDOH Screenings   Food Insecurity: No Food Insecurity (05/17/2023)  Housing: Low Risk  (05/17/2023)  Transportation Needs: No Transportation Needs (05/17/2023)  Utilities: Not At Risk (05/17/2023)  Financial Resource Strain: Low Risk  (01/09/2019)  Social Connections: Unknown (10/12/2021)   Received from [facility]  Tobacco Use: High Risk (05/17/2023)     Readmission Risk Interventions     No data to display

## 2023-05-18 NOTE — ED Notes (Signed)
Hospital bed ordered for patient at this time

## 2023-05-19 ENCOUNTER — Other Ambulatory Visit: Payer: Self-pay

## 2023-05-19 ENCOUNTER — Telehealth: Payer: Self-pay

## 2023-05-19 ENCOUNTER — Encounter: Payer: Self-pay | Admitting: Gastroenterology

## 2023-05-19 DIAGNOSIS — K922 Gastrointestinal hemorrhage, unspecified: Secondary | ICD-10-CM

## 2023-05-19 NOTE — Telephone Encounter (Signed)
-----   Message from Carie Caddy Pyrtle sent at 05/18/2023  4:43 PM EST ----- Omar Cross patient seen but going home after resolved lower GI bleed  He needs to come for direct colonoscopy with me in the [facility] Please schedule for previsit He needs CBC 1 week before this scheduled colonoscopy due to resolved lower GI bleed to ensure appropriate for [facility] procedure  Please schedule and copy this to the chart  JMP

## 2023-05-19 NOTE — Telephone Encounter (Signed)
Pt scheduled for previsit 06/21/23 at 8am, colon scheduled in the [facility] 07/26/23 at 1:30pm. Order in for CBC and lab to be drawn on 07/19/23. Order and reminder in epic. Appt letter mailed to pt.

## 2023-05-27 ENCOUNTER — Ambulatory Visit: Payer: Self-pay | Admitting: Physician Assistant

## 2023-06-21 ENCOUNTER — Ambulatory Visit (AMBULATORY_SURGERY_CENTER): Payer: Self-pay | Admitting: *Deleted

## 2023-06-21 VITALS — Ht 74.0 in | Wt 255.0 lb

## 2023-06-21 DIAGNOSIS — K922 Gastrointestinal hemorrhage, unspecified: Secondary | ICD-10-CM

## 2023-06-21 DIAGNOSIS — K921 Melena: Secondary | ICD-10-CM

## 2023-06-21 MED ORDER — PEG 3350-KCL-NA BICARB-NACL 420 G PO SOLR: 4000.0000 mL | Freq: Once | ORAL | 0 refills | Status: AC

## 2023-06-21 NOTE — Progress Notes (Signed)
Pt's name and DOB verified at the beginning of the pre-visit wit 2 identifiers  Pt denies any difficulty with ambulating,sitting, laying down or rolling side to side  Pt has no issues with ambulation   Pt has no issues moving head neck or swallowing  No egg or soy allergy known to patient   No issues known to pt with past sedation with any surgeries or procedures  Pt denies having issues being intubated  No FH of Malignant Hyperthermia  Pt is not on diet pills or shots  Pt is not on home 02   Pt is not on blood thinners   Pt denies issues with constipation   Pt is not on dialysis  Pt denise any abnormal heart rhythms   Pt denies any upcoming cardiac testing  Pt encouraged to use to use Singlecare or Goodrx to reduce cost   Patient's chart reviewed by Cathlyn Parsons CNRA prior to pre-visit and patient appropriate for the [facility].  Pre-visit completed and red dot placed by patient's name on their procedure day (on provider's schedule).  .  Visit by phone  Pt states weight is 255 lb  Instructed pt why it is important to and  to call if they have any changes in health or new medications. Directed them to the # given and on instructions.     Instructions reviewed. Pt given both [facility] main # and MD on call # prior to instructions.  Pt states understanding. Instructed to review again prior to procedure. Pt states they will.   Instructions sent by mail with coupon and by My Chart  Coupon sent via text to mobile phone and pt verified they received it

## 2023-07-16 ENCOUNTER — Telehealth: Payer: Self-pay

## 2023-07-16 NOTE — Telephone Encounter (Signed)
-----   Message from Nurse Kerrie Buffalo sent at 05/19/2023 12:35 PM EST ----- Regarding: FW: cbc  ----- Message ----- From: Chrystie Nose, RN Sent: 05/19/2023  12:34 PM EST To: Chrystie Nose, RN Subject: cbc                                            Pt needs CBC on 2/17 prior to colon the following week, order in epic.

## 2023-07-16 NOTE — Telephone Encounter (Signed)
Pt knows to come for labs on 07/19/23, orders in epic.

## 2023-07-19 ENCOUNTER — Other Ambulatory Visit (INDEPENDENT_AMBULATORY_CARE_PROVIDER_SITE_OTHER): Payer: Self-pay

## 2023-07-19 DIAGNOSIS — K922 Gastrointestinal hemorrhage, unspecified: Secondary | ICD-10-CM

## 2023-07-19 LAB — CBC WITH DIFFERENTIAL/PLATELET
Basophils Absolute: 0.1 10*3/uL (ref 0.0–0.1)
Basophils Relative: 2 % (ref 0.0–3.0)
Eosinophils Absolute: 0.1 10*3/uL (ref 0.0–0.7)
Eosinophils Relative: 1.9 % (ref 0.0–5.0)
HCT: 46.8 % (ref 39.0–52.0)
Hemoglobin: 16.1 g/dL (ref 13.0–17.0)
Lymphocytes Relative: 44.3 % (ref 12.0–46.0)
Lymphs Abs: 3.1 10*3/uL (ref 0.7–4.0)
MCHC: 34.3 g/dL (ref 30.0–36.0)
MCV: 96 fL (ref 78.0–100.0)
Monocytes Absolute: 0.6 10*3/uL (ref 0.1–1.0)
Monocytes Relative: 8.5 % (ref 3.0–12.0)
Neutro Abs: 3 10*3/uL (ref 1.4–7.7)
Neutrophils Relative %: 43.3 % (ref 43.0–77.0)
Platelets: 360 10*3/uL (ref 150.0–400.0)
RBC: 4.88 Mil/uL (ref 4.22–5.81)
RDW: 14.3 % (ref 11.5–15.5)
WBC: 6.9 10*3/uL (ref 4.0–10.5)

## 2023-07-20 NOTE — Progress Notes (Signed)
Patient's hemoglobin is normal, we can proceed with procedure in [facility] as scheduled next week

## 2023-07-22 ENCOUNTER — Encounter: Payer: Self-pay | Admitting: Certified Registered Nurse Anesthetist

## 2023-07-26 ENCOUNTER — Encounter: Payer: Self-pay | Admitting: Internal Medicine

## 2023-07-26 ENCOUNTER — Ambulatory Visit (AMBULATORY_SURGERY_CENTER): Payer: Self-pay | Admitting: Internal Medicine

## 2023-07-26 VITALS — BP 156/105 | HR 63 | Temp 97.2°F | Resp 11 | Ht 74.0 in | Wt 255.0 lb

## 2023-07-26 DIAGNOSIS — K5730 Diverticulosis of large intestine without perforation or abscess without bleeding: Secondary | ICD-10-CM

## 2023-07-26 DIAGNOSIS — K648 Other hemorrhoids: Secondary | ICD-10-CM

## 2023-07-26 DIAGNOSIS — K529 Noninfective gastroenteritis and colitis, unspecified: Secondary | ICD-10-CM

## 2023-07-26 DIAGNOSIS — K921 Melena: Secondary | ICD-10-CM

## 2023-07-26 DIAGNOSIS — K625 Hemorrhage of anus and rectum: Secondary | ICD-10-CM

## 2023-07-26 DIAGNOSIS — K5710 Diverticulosis of small intestine without perforation or abscess without bleeding: Secondary | ICD-10-CM

## 2023-07-26 DIAGNOSIS — K6389 Other specified diseases of intestine: Secondary | ICD-10-CM

## 2023-07-26 DIAGNOSIS — D123 Benign neoplasm of transverse colon: Secondary | ICD-10-CM

## 2023-07-26 DIAGNOSIS — K922 Gastrointestinal hemorrhage, unspecified: Secondary | ICD-10-CM

## 2023-07-26 MED ORDER — SODIUM CHLORIDE 0.9 % IV SOLN: 500.0000 mL | INTRAVENOUS | Status: DC

## 2023-07-26 NOTE — Patient Instructions (Signed)
 Discharge instructions given. Handouts on polyps,diverticulosis and hemorrhoids. Resume previous medications. Avoid NSAIDS. YOU HAD AN ENDOSCOPIC PROCEDURE TODAY AT THE [facility]:   Refer to the procedure report that was given to you for any specific questions about what was found during the examination.  If the procedure report does not answer your questions, please call your gastroenterologist to clarify.  If you requested that your care partner not be given the details of your procedure findings, then the procedure report has been included in a sealed envelope for you to review at your convenience later.  YOU SHOULD EXPECT: Some feelings of bloating in the abdomen. Passage of more gas than usual.  Walking can help get rid of the air that was put into your GI tract during the procedure and reduce the bloating. If you had a lower endoscopy (such as a colonoscopy or flexible sigmoidoscopy) you may notice spotting of blood in your stool or on the toilet paper. If you underwent a bowel prep for your procedure, you may not have a normal bowel movement for a few days.  Please Note:  You might notice some irritation and congestion in your nose or some drainage.  This is from the oxygen used during your procedure.  There is no need for concern and it should clear up in a day or so.  SYMPTOMS TO REPORT IMMEDIATELY:  Following lower endoscopy (colonoscopy or flexible sigmoidoscopy):  Excessive amounts of blood in the stool  Significant tenderness or worsening of abdominal pains  Swelling of the abdomen that is new, acute  Fever of 100F or higher   For urgent or emergent issues, a gastroenterologist can be reached at any hour by calling [phone]. Do not use MyChart messaging for urgent concerns.    DIET:  We do recommend a small meal at first, but then you may proceed to your regular diet.  Drink plenty of fluids but you should avoid alcoholic beverages for 24 hours.  ACTIVITY:   You should plan to take it easy for the rest of today and you should NOT DRIVE or use heavy machinery until tomorrow (because of the sedation medicines used during the test).    FOLLOW UP: Our staff will call the number listed on your records the next business day following your procedure.  We will call around 7:15- 8:00 am to check on you and address any questions or concerns that you may have regarding the information given to you following your procedure. If we do not reach you, we will leave a message.     If any biopsies were taken you will be contacted by phone or by letter within the next 1-3 weeks.  Please call us at [phone] if you have not heard about the biopsies in 3 weeks.    SIGNATURES/CONFIDENTIALITY: You and/or your care partner have signed paperwork which will be entered into your electronic medical record.  These signatures attest to the fact that that the information above on your After Visit Summary has been reviewed and is understood.  Full responsibility of the confidentiality of this discharge information lies with you and/or your care-partner.

## 2023-07-26 NOTE — Op Note (Signed)
 [facility] Patient Name: Omar Cross Procedure Date: 07/26/2023 1:17 PM MRN: [id number] Endoscopist: Beverley Fiedler , MD, [id number] Age: 58 Referring MD:  Date of Birth: [birth year] Gender: Male Account #: [id number] Procedure:                Colonoscopy Indications:              Hematochezia (isolated in Dec 2024), recent Hgb                            normal at 16.1 on 07/19/2023 Medicines:                Monitored Anesthesia Care Procedure:                Pre-Anesthesia Assessment:                           - Prior to the procedure, a History and Physical                            was performed, and patient medications and                            allergies were reviewed. The patient's tolerance of                            previous anesthesia was also reviewed. The risks                            and benefits of the procedure and the sedation                            options and risks were discussed with the patient.                            All questions were answered, and informed consent                            was obtained. Prior Anticoagulants: The patient has                            taken no anticoagulant or antiplatelet agents. ASA                            Grade Assessment: II - A patient with mild systemic                            disease. After reviewing the risks and benefits,                            the patient was deemed in satisfactory condition to                            undergo the procedure.  After obtaining informed consent, the colonoscope                            was passed under direct vision. Throughout the                            procedure, the patient's blood pressure, pulse, and                            oxygen saturations were monitored continuously. The                            CF [id number] #[id number] was introduced through the anus                            and advanced to the terminal  ileum. The colonoscopy                            was performed without difficulty. The patient                            tolerated the procedure well. The quality of the                            bowel preparation was good. The terminal ileum,                            ileocecal valve, appendiceal orifice, and rectum                            were photographed. Scope In: 1:24:52 PM Scope Out: 1:38:06 PM Scope Withdrawal Time: 0 hours 11 minutes 55 seconds  Total Procedure Duration: 0 hours 13 minutes 14 seconds  Findings:                 The digital rectal exam was normal.                           The distal ileum contained a single diminutive                            diverticulum.                           The remainder of the exam in the terminal ileum was                            normal.                           Segmental mild mucosal changes characterized by                            erosions, erythema, friability, granularity and  shallow ulcerations were found in the sigmoid colon                            and in the ascending colon. Biopsies were taken                            with a cold forceps for histology.                           A 6 mm polyp was found in the proximal transverse                            colon. The polyp was sessile. The polyp was removed                            with a cold snare. Resection and retrieval were                            complete.                           Multiple medium-mouthed and small-mouthed                            diverticula were found from transverse colon to                            sigmoid colon. Erythema was seen in association                            with the diverticular opening in the sigmoid colon                            only.                           Internal hemorrhoids were found during                            retroflexion. The hemorrhoids were  small. Complications:            No immediate complications. Estimated Blood Loss:     Estimated blood loss was minimal. Impression:               - Ileal diverticulum.                           - Normal examined ileal mucosa.                           - Segmental mild mucosal changes were found in the                            sigmoid colon and in the ascending colon. Biopsied  to exclude inflammatory bowel disease (if present                            then would be most consistent with colonic Crohn's                            disease).                           - One 6 mm polyp in the proximal transverse colon,                            removed with a cold snare. Resected and retrieved.                           - Mild diverticulosis from transverse colon to                            sigmoid colon. Erythema was seen in association                            with the diverticular opening in the sigmoid                            (possible SCAD, but with ascending colon changes as                            above, exclude IBD).                           - Small internal hemorrhoids. Recommendation:           - Patient has a contact number available for                            emergencies. The signs and symptoms of potential                            delayed complications were discussed with the                            patient. Return to normal activities tomorrow.                            Written discharge instructions were provided to the                            patient.                           - Resume previous diet.                           - Continue present medications. Avoid NSAIDs.                           -  Await pathology results.                           - Repeat colonoscopy is recommended. The                            colonoscopy date will be determined after pathology                            results from today's exam  become available for                            review. Beverley Fiedler, MD 07/26/2023 1:45:22 PM This report has been signed electronically.

## 2023-07-26 NOTE — Progress Notes (Signed)
 Report given to PACU, vss

## 2023-07-26 NOTE — Progress Notes (Signed)
 GASTROENTEROLOGY PROCEDURE H&P NOTE   Primary Care Physician: Julieanne Manson, MD    Reason for Procedure:  Rectal bleeding  Plan:    Colonoscopy  Patient is appropriate for endoscopic procedure(s) in the ambulatory ([facility]) setting.  The nature of the procedure, as well as the risks, benefits, and alternatives were carefully and thoroughly reviewed with the patient. Ample time for discussion and questions allowed. The patient understood, was satisfied, and agreed to proceed.     HPI: Omar Cross is a 58 y.o. male who presents for colonoscopy.  Medical history as below.  Tolerated the prep.  No recent chest pain or shortness of breath.  No abdominal pain today.  Past Medical History:  Diagnosis Date   Cancer [facility])    Fibrosarcoma   Pulmonary embolism (HCC)    Sciatic pain     Past Surgical History:  Procedure Laterality Date   ANKLE SURGERY     excision of fibrosarcoma,low back  03/01/2001   [facility].  No adjuvant therapy.   lipoma surgeries      Prior to Admission medications   Medication Sig Start Date End Date Taking? Authorizing Provider  Multiple Vitamin (MULTIVITAMIN) capsule Take 1 capsule by mouth daily.    [provider]    Current Outpatient Medications  Medication Sig Dispense Refill   Multiple Vitamin (MULTIVITAMIN) capsule Take 1 capsule by mouth daily.     Current Facility-Administered Medications  Medication Dose Route Frequency Provider Last Rate Last Admin   0.9 %  sodium chloride infusion  500 mL Intravenous Continuous Tayvon Culley, Carie Caddy, MD        Allergies as of 07/26/2023   (No Known Allergies)    Family History  Problem Relation Age of Onset   Hypertension Mother    Healthy Father    Colon cancer Neg Hx    Colon polyps Neg Hx    Esophageal cancer Neg Hx    Stomach cancer Neg Hx    Rectal cancer Neg Hx     Social History   Socioeconomic History   Marital status: Married    Spouse name: [place]   Number of  children: 5   Years of education: 12   Highest education level: High school graduate  Occupational History   Not on file  Tobacco Use   Smoking status: Every Day    Current packs/day: 0.50    Average packs/day: 0.5 packs/day for 34.0 years (17.0 ttl pk-yrs)    Types: Cigarettes   Smokeless tobacco: Never  Vaping Use   Vaping status: Never Used  Substance and Sexual Activity   Alcohol use: Yes    Comment: 03/2021:  now half glass of wine watered down.   Drug use: Not Currently    Types: "Crack" cocaine    Comment: Last used in 2017   Sexual activity: Yes  Other Topics Concern   Not on file  Social History Narrative   Lives at home for past 8 months with his wife.   She goes back and forth from [place].   They were separated for a couple of years.   Many problems with adult children currently   Friend who had an MI currently living with them   Social Drivers of Health   Financial Resource Strain: Low Risk  (01/09/2019)   Overall Financial Resource Strain (CARDIA)    Difficulty of Paying Living Expenses: Not hard at all  Food Insecurity: No Food Insecurity (05/17/2023)   Hunger Vital Sign  Worried About Programme researcher, broadcasting/film/video in the Last Year: Never true    Ran Out of Food in the Last Year: Never true  Transportation Needs: No Transportation Needs (05/17/2023)   PRAPARE - Administrator, Civil Service (Medical): No    Lack of Transportation (Non-Medical): No  Physical Activity: Not on file  Stress: Not on file  Social Connections: Unknown (10/12/2021)   Received from [facility]   Social Network    Social Network: Not on file  Intimate Partner Violence: Not At Risk (05/17/2023)   Humiliation, Afraid, Rape, and Kick questionnaire    Fear of Current or Ex-Partner: No    Emotionally Abused: No    Physically Abused: No    Sexually Abused: No    Physical Exam: Vital signs in last 24 hours: @BP  (!) 143/95   Pulse 64   Temp (!) 97.2 F (36.2 C)   Resp  10   Ht 6\' 2"  (1.88 m)   Wt 255 lb (115.7 kg)   SpO2 100%   BMI 32.74 kg/m  GEN: NAD EYE: Sclerae anicteric ENT: MMM CV: Non-tachycardic Pulm: CTA b/l GI: Soft, NT/ND NEURO:  Alert & Oriented x 3   Erick Blinks, MD [facility]  07/26/2023 1:22 PM

## 2023-07-26 NOTE — Progress Notes (Signed)
 Called to room to assist during endoscopic procedure.  Patient ID and intended procedure confirmed with present staff. Received instructions for my participation in the procedure from the performing physician.

## 2023-07-26 NOTE — Progress Notes (Signed)
 Pt's states no medical or surgical changes since previsit or office visit.

## 2023-07-27 ENCOUNTER — Telehealth: Payer: Self-pay | Admitting: *Deleted

## 2023-07-27 NOTE — Telephone Encounter (Signed)
 No voicemail reached on f/u call,unable to leave mess

## 2023-07-29 LAB — SURGICAL PATHOLOGY

## 2023-07-30 ENCOUNTER — Ambulatory Visit: Payer: Self-pay | Admitting: Gastroenterology

## 2023-08-02 ENCOUNTER — Encounter: Payer: Self-pay | Admitting: Internal Medicine

## 2024-02-13 ENCOUNTER — Emergency Department (HOSPITAL_COMMUNITY)
Admission: EM | Admit: 2024-02-13 | Discharge: 2024-02-14 | Discharge status: Against Medical Advice | Payer: Self-pay | Attending: Emergency Medicine | Admitting: Emergency Medicine

## 2024-02-13 ENCOUNTER — Encounter (HOSPITAL_COMMUNITY): Payer: Self-pay

## 2024-02-13 ENCOUNTER — Other Ambulatory Visit: Payer: Self-pay

## 2024-02-13 DIAGNOSIS — M549 Dorsalgia, unspecified: Secondary | ICD-10-CM | POA: Insufficient documentation

## 2024-02-13 DIAGNOSIS — Z5321 Procedure and treatment not carried out due to patient leaving prior to being seen by health care provider: Secondary | ICD-10-CM | POA: Insufficient documentation

## 2024-02-13 DIAGNOSIS — W19XXXA Unspecified fall, initial encounter: Secondary | ICD-10-CM | POA: Insufficient documentation

## 2024-02-13 DIAGNOSIS — F10120 Alcohol abuse with intoxication, uncomplicated: Secondary | ICD-10-CM | POA: Insufficient documentation

## 2024-02-13 DIAGNOSIS — R4781 Slurred speech: Secondary | ICD-10-CM | POA: Insufficient documentation

## 2024-02-13 LAB — CBC WITH DIFFERENTIAL/PLATELET
Abs Immature Granulocytes: 0.02 K/uL (ref 0.00–0.07)
Basophils Absolute: 0 K/uL (ref 0.0–0.1)
Basophils Relative: 0 %
Eosinophils Absolute: 0.1 K/uL (ref 0.0–0.5)
Eosinophils Relative: 2 %
HCT: 48.6 % (ref 39.0–52.0)
Hemoglobin: 16.6 g/dL (ref 13.0–17.0)
Immature Granulocytes: 0 %
Lymphocytes Relative: 49 %
Lymphs Abs: 3.4 K/uL (ref 0.7–4.0)
MCH: 32.4 pg (ref 26.0–34.0)
MCHC: 34.2 g/dL (ref 30.0–36.0)
MCV: 94.7 fL (ref 80.0–100.0)
Monocytes Absolute: 0.5 K/uL (ref 0.1–1.0)
Monocytes Relative: 7 %
Neutro Abs: 3 K/uL (ref 1.7–7.7)
Neutrophils Relative %: 42 %
Platelets: 289 K/uL (ref 150–400)
RBC: 5.13 MIL/uL (ref 4.22–5.81)
RDW: 13.9 % (ref 11.5–15.5)
WBC: 7 K/uL (ref 4.0–10.5)
nRBC: 0 % (ref 0.0–0.2)

## 2024-02-13 LAB — COMPREHENSIVE METABOLIC PANEL WITH GFR
ALT: 55 U/L — ABNORMAL HIGH (ref 0–44)
AST: 40 U/L (ref 15–41)
Albumin: 3.7 g/dL (ref 3.5–5.0)
Alkaline Phosphatase: 83 U/L (ref 38–126)
Anion gap: 13 (ref 5–15)
BUN: 9 mg/dL (ref 6–20)
CO2: 21 mmol/L — ABNORMAL LOW (ref 22–32)
Calcium: 8.6 mg/dL — ABNORMAL LOW (ref 8.9–10.3)
Chloride: 104 mmol/L (ref 98–111)
Creatinine, Ser: 0.94 mg/dL (ref 0.61–1.24)
GFR, Estimated: >60 mL/min (ref >60)
Glucose, Bld: 110 mg/dL — ABNORMAL HIGH (ref 70–99)
Potassium: 3.6 mmol/L (ref 3.5–5.1)
Sodium: 138 mmol/L (ref 135–145)
Total Bilirubin: 0.5 mg/dL (ref 0.0–1.2)
Total Protein: 7.8 g/dL (ref 6.5–8.1)

## 2024-02-13 NOTE — ED Notes (Signed)
 Spoke with Dr Rogelia, who gave verbal order for labs only at this time. Pt placed in wheelchair and aware to let staff know of any needs and/or concerns.

## 2024-02-13 NOTE — ED Notes (Signed)
 Pt told this NT he is leaving and stated I would rather die than wait here. Pt seen leaving the ED.

## 2024-02-13 NOTE — ED Triage Notes (Signed)
 Pt BIB [facility] for back pain. Pt reports that he fell 2 weeks ago and injured his back. Pt states that he has left sided back pain from top to bottom and pain worsens when he takes a deep breath and has worsened x 4 days. Pt states that he had blood clots many years ago and he is concerned that he may have another one. Pt denies SOB and CP at this time. Pt is positive for ETOH and obviously intoxicated with slurred speech and random thoughts. Pt states that he has had 6 beers and 6 shots of vodka today because he was trying to ease the pain.SABRA
